# Patient Record
Sex: Female | Born: 1995 | Race: White | Hispanic: No | Marital: Single | Smoking: Current every day smoker
Health system: Southern US, Community
[De-identification: ages and names within clinical notes are randomized; demographics above are authoritative.]

---
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
## 2015-12-17 ENCOUNTER — Encounter: Payer: Self-pay | Admitting: Emergency Medicine

## 2015-12-17 ENCOUNTER — Emergency Department
Admission: EM | Admit: 2015-12-17 | Discharge: 2015-12-17 | Disposition: A | Discharge status: Home / Self Care | Payer: Self-pay | Attending: Emergency Medicine | Admitting: Emergency Medicine

## 2015-12-17 DIAGNOSIS — Z3202 Encounter for pregnancy test, result negative: Secondary | ICD-10-CM | POA: Insufficient documentation

## 2015-12-17 DIAGNOSIS — K219 Gastro-esophageal reflux disease without esophagitis: Secondary | ICD-10-CM | POA: Insufficient documentation

## 2015-12-17 DIAGNOSIS — F17200 Nicotine dependence, unspecified, uncomplicated: Secondary | ICD-10-CM | POA: Insufficient documentation

## 2015-12-17 LAB — POCT PREGNANCY, URINE: Preg Test, Ur: NEGATIVE

## 2015-12-17 LAB — COMPREHENSIVE METABOLIC PANEL
ALT: 17 U/L (ref 14–54)
AST: 21 U/L (ref 15–41)
Albumin: 4.6 g/dL (ref 3.5–5.0)
Alkaline Phosphatase: 49 U/L (ref 38–126)
Anion gap: 11 (ref 5–15)
BILIRUBIN TOTAL: 0.5 mg/dL (ref 0.3–1.2)
BUN: 10 mg/dL (ref 6–20)
CO2: 21 mmol/L — ABNORMAL LOW (ref 22–32)
CREATININE: 0.7 mg/dL (ref 0.44–1.00)
Calcium: 9.5 mg/dL (ref 8.9–10.3)
Chloride: 106 mmol/L (ref 101–111)
Glucose, Bld: 117 mg/dL — ABNORMAL HIGH (ref 65–99)
POTASSIUM: 3.7 mmol/L (ref 3.5–5.1)
Sodium: 138 mmol/L (ref 135–145)
TOTAL PROTEIN: 7.3 g/dL (ref 6.5–8.1)

## 2015-12-17 LAB — CBC
HEMATOCRIT: 41.2 % (ref 35.0–47.0)
Hemoglobin: 14 g/dL (ref 12.0–16.0)
MCH: 30.3 pg (ref 26.0–34.0)
MCHC: 34 g/dL (ref 32.0–36.0)
MCV: 89.2 fL (ref 80.0–100.0)
PLATELETS: 301 10*3/uL (ref 150–440)
RBC: 4.62 MIL/uL (ref 3.80–5.20)
RDW: 13.2 % (ref 11.5–14.5)
WBC: 7.8 10*3/uL (ref 3.6–11.0)

## 2015-12-17 LAB — URINALYSIS COMPLETE WITH MICROSCOPIC (ARMC ONLY)
BILIRUBIN URINE: NEGATIVE
GLUCOSE, UA: NEGATIVE mg/dL
HGB URINE DIPSTICK: NEGATIVE
LEUKOCYTES UA: NEGATIVE
NITRITE: NEGATIVE
Protein, ur: NEGATIVE mg/dL
SPECIFIC GRAVITY, URINE: 1.023 (ref 1.005–1.030)
pH: 6 (ref 5.0–8.0)

## 2015-12-17 LAB — LIPASE, BLOOD: LIPASE: 21 U/L (ref 11–51)

## 2015-12-17 MED ORDER — FAMOTIDINE 20 MG PO TABS: 20.0000 mg | ORAL_TABLET | Freq: Two times a day (BID) | ORAL | Status: DC

## 2015-12-17 MED ORDER — ONDANSETRON HCL 4 MG/2ML IJ SOLN
4.0000 mg | Freq: Once | INTRAMUSCULAR | Status: AC
  Administered 2015-12-17: 4 mg via INTRAVENOUS

## 2015-12-17 MED ORDER — ONDANSETRON HCL 4 MG PO TABS: 4.0000 mg | ORAL_TABLET | Freq: Every day | ORAL | Status: DC | PRN

## 2015-12-17 MED ORDER — ONDANSETRON HCL 4 MG/2ML IJ SOLN
INTRAMUSCULAR | Status: AC
  Filled 2015-12-17: qty 2

## 2015-12-17 MED ORDER — ONDANSETRON 4 MG PO TBDP
4.0000 mg | ORAL_TABLET | Freq: Once | ORAL | Status: AC | PRN
  Administered 2015-12-17: 4 mg via ORAL
  Filled 2015-12-17: qty 1

## 2015-12-17 MED ORDER — SUCRALFATE 1 G PO TABS: 1.0000 g | ORAL_TABLET | Freq: Four times a day (QID) | ORAL | Status: DC

## 2015-12-17 MED ORDER — FAMOTIDINE IN NACL 20-0.9 MG/50ML-% IV SOLN
20.0000 mg | Freq: Once | INTRAVENOUS | Status: AC
  Administered 2015-12-17: 20 mg via INTRAVENOUS
  Filled 2015-12-17: qty 50

## 2015-12-17 MED ORDER — SODIUM CHLORIDE 0.9 % IV SOLN
Freq: Once | INTRAVENOUS | Status: AC
  Administered 2015-12-17: 14:00:00 via INTRAVENOUS

## 2015-12-17 NOTE — ED Notes (Signed)
Says had stomach flu about a week ago.  Now this am started vomiting again.  Says she has indigestion feeling.

## 2015-12-17 NOTE — ED Provider Notes (Signed)
[facility] Emergency Department Provider Note     Time seen: ----------------------------------------- 3:02 PM on 12/17/2015 -----------------------------------------    I have reviewed the triage vital signs and the nursing notes.   HISTORY  Chief Complaint Gastroesophageal Reflux and Nausea    HPI Kendra Carey is a 20 y.o. female who presents ER for abdominal discomfort. Patient states she had severe vomiting but a week ago, it recurred and then resolved. Patient states his morning started vomiting again, states she has an indigestion feeling. She is under a lot of stress, takes Tums on occasion, does smoke, does not enjoy spicy foods, takes NSAIDs on occasion. She denies any fevers chills or other complaints.   History reviewed. No pertinent past medical history.  There are no active problems to display for this patient.   No past surgical history on file.  Allergies Review of patient's allergies indicates no known allergies.  Social History Social History  Substance Use Topics  . Smoking status: Current Every Day Smoker  . Smokeless tobacco: None  . Alcohol Use: No    Review of Systems Constitutional: Negative for fever. Eyes: Negative for visual changes. ENT: Negative for sore throat. Cardiovascular: Negative for chest pain. Respiratory: Negative for shortness of breath. Gastrointestinal: As for abdominal pain and nausea Genitourinary: Negative for dysuria. Musculoskeletal: Negative for back pain. Skin: Negative for rash. Neurological: Negative for headaches, focal weakness or numbness.  10-point ROS otherwise negative.  ____________________________________________   PHYSICAL EXAM:  VITAL SIGNS: ED Triage Vitals  Enc Vitals Group     BP 12/17/15 1032 147/86 mmHg     Pulse Rate 12/17/15 1032 82     Resp 12/17/15 1032 16     Temp 12/17/15 1032 97.4 F (36.3 C)     Temp Source 12/17/15 1032 Oral     SpO2 12/17/15 1032 10  %     Weight 12/17/15 1032 105 lb (47.628 kg)     Height 12/17/15 1032  (1.6 m)     Head Cir --      Peak Flow --      Pain Score 12/17/15 1033 4     Pain Loc --      Pain Edu? --      Excl. in GC? --     Constitutional: Alert and oriented. Well appearing and in no distress. Eyes: Conjunctivae are normal. PERRL. Normal extraocular movements. ENT   Head: Normocephalic and atraumatic.   Nose: No congestion/rhinnorhea.   Mouth/Throat: Mucous membranes are moist.   Neck: No stridor. Cardiovascular: Normal rate, regular rhythm. Normal and symmetric distal pulses are present in all extremities. No murmurs, rubs, or gallops. Respiratory: Normal respiratory effort without tachypnea nor retractions. Breath sounds are clear and equal bilaterally. No wheezes/rales/rhonchi. Gastrointestinal: Soft and nontender. No distention. No abdominal bruits.  Musculoskeletal: Nontender with normal range of motion in all extremities. No joint effusions.  No lower extremity tenderness nor edema. Neurologic:  Normal speech and language. No gross focal neurologic deficits are appreciated. Speech is normal. No gait instability. Skin:  Skin is warm, dry and intact. No rash noted. Psychiatric: Mood and affect are normal. Speech and behavior are normal. Patient exhibits appropriate insight and judgment. ____________________________________________  ED COURSE:  Pertinent labs & imaging results that were available during my care of the patient were reviewed by me and considered in my medical decision making (see chart for details). Patient is in no acute distress, likely GERD symptoms. She'll be given fluids antiemetics and  antacids. ____________________________________________    LABS (pertinent positives/negatives)  Labs Reviewed  COMPREHENSIVE METABOLIC PANEL - Abnormal; Notable for the following:    CO2 21 (*)    Glucose, Bld 117 (*)    All other components within normal limits   URINALYSIS COMPLETEWITH MICROSCOPIC ([facility] ONLY) - Abnormal; Notable for the following:    Color, Urine YELLOW (*)    APPearance HAZY (*)    Ketones, ur 1+ (*)    Bacteria, UA MANY (*)    Squamous Epithelial / LPF 6-30 (*)    All other components within normal limits  LIPASE, BLOOD  CBC  POC URINE PREG, ED  POCT PREGNANCY, URINE   ____________________________________________  FINAL ASSESSMENT AND PLAN  GERD  Plan: Patient with labs as dictated above. Patient was started on medications as dictated above. She'll be discharged with Pepcid and Carafate and referred to GI for follow-up. She currently is feeling better.   Emily Filbert, MD   Emily Filbert, MD 12/17/15 [phone]

## 2015-12-17 NOTE — Discharge Instructions (Signed)

## 2016-09-12 ENCOUNTER — Ambulatory Visit (INDEPENDENT_AMBULATORY_CARE_PROVIDER_SITE_OTHER): Payer: Medicaid Other | Admitting: Obstetrics and Gynecology

## 2016-09-12 VITALS — BP 105/69 | HR 86 | Wt 112.9 lb

## 2016-09-12 DIAGNOSIS — O039 Complete or unspecified spontaneous abortion without complication: Secondary | ICD-10-CM

## 2016-09-12 DIAGNOSIS — N912 Amenorrhea, unspecified: Secondary | ICD-10-CM

## 2016-09-12 DIAGNOSIS — Z369 Encounter for antenatal screening, unspecified: Secondary | ICD-10-CM

## 2016-09-12 DIAGNOSIS — Z1389 Encounter for screening for other disorder: Secondary | ICD-10-CM

## 2016-09-12 DIAGNOSIS — K219 Gastro-esophageal reflux disease without esophagitis: Secondary | ICD-10-CM | POA: Insufficient documentation

## 2016-09-12 DIAGNOSIS — Z8711 Personal history of peptic ulcer disease: Secondary | ICD-10-CM | POA: Insufficient documentation

## 2016-09-12 DIAGNOSIS — Z113 Encounter for screening for infections with a predominantly sexual mode of transmission: Secondary | ICD-10-CM

## 2016-09-12 DIAGNOSIS — Z8719 Personal history of other diseases of the digestive system: Secondary | ICD-10-CM

## 2016-09-12 NOTE — Patient Instructions (Signed)
Pregnancy and Zika Virus Disease Introduction Zika virus disease, or Zika, is an illness that can spread to people from mosquitoes that carry the virus. It may also spread from person to person through infected body fluids. Zika first occurred in [place], but recently it has spread to new areas. The virus occurs in tropical climates. The location of Zika continues to change. Most people who become infected with Zika virus do not develop serious illness. However, Zika may cause birth defects in an unborn baby whose mother is infected with the virus. It may also increase the risk of miscarriage. What are the symptoms of Zika virus disease? In many cases, people who have been infected with Zika virus do not develop any symptoms. If symptoms appear, they usually start about a week after the person is infected. Symptoms are usually mild. They may include:  Fever.  Rash.  Red eyes.  Joint pain. How does Zika virus disease spread? The main way that Zika virus spreads is through the bite of a certain type of mosquito. Unlike most types of mosquitos, which bite only at night, the type of mosquito that carries Zika virus bites both at night and during the day. Zika virus can also spread through sexual contact, through a blood transfusion, and from a mother to her baby before or during birth. Once you have had Zika virus disease, it is unlikely that you will get it again. Can I pass Zika to my baby during pregnancy? Yes, Zika can pass from a mother to her baby before or during birth. What problems can Zika cause for my baby? A woman who is infected with Zika virus while pregnant is at risk of having her baby born with a condition in which the brain or head is smaller than expected (microcephaly). Babies who have microcephaly can have developmental delays, seizures, hearing problems, and vision problems. Having Zika virus disease during pregnancy can also increase the risk of miscarriage. How can Zika  virus disease be prevented? There is no vaccine to prevent Zika. The best way to prevent the disease is to avoid infected mosquitoes and avoid exposure to body fluids that can spread the virus. Avoid any possible exposure to Zika by taking the following precautions. For women and their sex partners:  Avoid traveling to high-risk areas. The locations where Zika is being reported change often. To identify high-risk areas, check the CDC travel website: www.cdc.gov/zika/geo/index.html  If you or your sex partner must travel to a high-risk area, talk with a health care provider before and after traveling.  Take all precautions to avoid mosquito bites if you live in, or travel to, any of the high-risk areas. Insect repellents are safe to use during pregnancy.  Ask your health care provider when it is safe to have sexual contact. For women:  If you are pregnant or trying to become pregnant, avoid sexual contact with persons who may have been exposed to Zika virus, persons who have possible symptoms of Zika, or persons whose history you are unsure about. If you choose to have sexual contact with someone who may have been exposed to Zika virus, use condoms correctly during the entire duration of sexual activity, every time. Do not share sexual devices, as you may be exposed to body fluids.  Ask your health care provider about when it is safe to attempt pregnancy after a possible exposure to Zika virus. What steps should I take to avoid mosquito bites? Take these steps to avoid mosquito bites when you   are in a high-risk area:  Wear loose clothing that covers your arms and legs.  Limit your outdoor activities.  Do not open windows unless they have window screens.  Sleep under mosquito nets.  Use insect repellent. The best insect repellents have:  DEET, picaridin, oil of lemon eucalyptus (OLE), or IR3535 in them.  Higher amounts of an active ingredient in them.  Remember that insect repellents  are safe to use during pregnancy.  Do not use OLE on children who are younger than 3 years of age. Do not use insect repellent on babies who are younger than 2 months of age.  Cover your child's stroller with mosquito netting. Make sure the netting fits snugly and that any loose netting does not cover your child's mouth or nose. Do not use a blanket as a mosquito-protection cover.  Do not apply insect repellent underneath clothing.  If you are using sunscreen, apply the sunscreen before applying the insect repellent.  Treat clothing with permethrin. Do not apply permethrin directly to your skin. Follow label directions for safe use.  Get rid of standing water, where mosquitoes may reproduce. Standing water is often found in items such as buckets, bowls, animal food dishes, and flowerpots. When you return from traveling to any high-risk area, continue taking actions to protect yourself against mosquito bites for 3 weeks, even if you show no signs of illness. This will prevent spreading Zika virus to uninfected mosquitoes. What should I know about the sexual transmission of Zika? People can spread Zika to their sexual partners during vaginal, anal, or oral sex, or by sharing sexual devices. Many people with Zika do not develop symptoms, so a person could spread the disease without knowing that they are infected. The greatest risk is to women who are pregnant or who may become pregnant. Zika virus can live longer in semen than it can live in blood. Couples can prevent sexual transmission of the virus by:  Using condoms correctly during the entire duration of sexual activity, every time. This includes vaginal, anal, and oral sex.  Not sharing sexual devices. Sharing increases your risk of being exposed to body fluid from another person.  Avoiding all sexual activity until your health care provider says it is safe. Should I be tested for Zika virus? A sample of your blood can be tested for Zika  virus. A pregnant woman should be tested if she may have been exposed to the virus or if she has symptoms of Zika. She may also have additional tests done during her pregnancy, such ultrasound testing. Talk with your health care provider about which tests are recommended. This information is not intended to replace advice given to you by your health care provider. Make sure you discuss any questions you have with your health care provider. Document Released: 07/01/2015 Document Revised: 03/17/2016 Document Reviewed: 06/24/2015  2017 Elsevier Minor Illnesses and Medications in Pregnancy  Cold/Flu:  Sudafed for congestion- Robitussin (plain) for cough- Tylenol for discomfort.  Please follow the directions on the label.  Try not to take any more than needed.  OTC Saline nasal spray and air humidifier or cool-mist  Vaporizer to sooth nasal irritation and to loosen congestion.  It is also important to increase intake of non carbonated fluids, especially if you have a fever.  Constipation:  Colace-2 capsules at bedtime; Metamucil- follow directions on label; Senokot- 1 tablet at bedtime.  Any one of these medications can be used.  It is also very important to increase   fluids and fruits along with regular exercise.  If problem persists please call the office.  Diarrhea:  Kaopectate as directed on the label.  Eat a bland diet and increase fluids.  Avoid highly seasoned foods.  Headache:  Tylenol 1 or 2 tablets every 3-4 hours as needed  Indigestion:  Maalox, Mylanta, Tums or Rolaids- as directed on label.  Also try to eat small meals and avoid fatty, greasy or spicy foods.  Nausea with or without Vomiting:  Nausea in pregnancy is caused by increased levels of hormones in the body which influence the digestive system and cause irritation when stomach acids accumulate.  Symptoms usually subside after 1st trimester of pregnancy.  Try the following: 1. Keep saltines, graham crackers or dry toast by your bed to  eat upon awakening. 2. Don't let your stomach get empty.  Try to eat 5-6 small meals per day instead of 3 large ones. 3. Avoid greasy fatty or highly seasoned foods.  4. Take OTC Unisom 1 tablet at bed time along with OTC Vitamin B6 25-50 mg 3 times per day.    If nausea continues with vomiting and you are unable to keep down food and fluids you may need a prescription medication.  Please notify your provider.   Sore throat:  Chloraseptic spray, throat lozenges and or plain Tylenol.  Vaginal Yeast Infection:  OTC Monistat for 7 days as directed on label.  If symptoms do not resolve within a week notify provider.  If any of the above problems do not subside with recommended treatment please call the office for further assistance.   Do not take Aspirin, Advil, Motrin or Ibuprofen.  * * OTC= Over the counter Hyperemesis Gravidarum Hyperemesis gravidarum is a severe form of nausea and vomiting that happens during pregnancy. Hyperemesis is worse than morning sickness. It may cause you to have nausea or vomiting all day for many days. It may keep you from eating and drinking enough food and liquids. Hyperemesis usually occurs during the first half (the first 20 weeks) of pregnancy. It often goes away once a woman is in her second half of pregnancy. However, sometimes hyperemesis continues through an entire pregnancy. What are the causes? The cause of this condition is not known. It may be related to changes in chemicals (hormones) in the body during pregnancy, such as the high level of pregnancy hormone (human chorionic gonadotropin) or the increase in the female sex hormone (estrogen). What are the signs or symptoms? Symptoms of this condition include:  Severe nausea and vomiting.  Nausea that does not go away.  Vomiting that does not allow you to keep any food down.  Weight loss.  Body fluid loss (dehydration).  Having no desire to eat, or not liking food that you have previously  enjoyed. How is this diagnosed? This condition may be diagnosed based on:  A physical exam.  Your medical history.  Your symptoms.  Blood tests.  Urine tests. How is this treated? This condition may be managed with medicine. If medicines to do not help relieve nausea and vomiting, you may need to receive fluids through an IV tube at the hospital. Follow these instructions at home:  Take over-the-counter and prescription medicines only as told by your health care provider.  Avoid iron pills and multivitamins that contain iron for the first 3-4 months of pregnancy. If you take prescription iron pills, do not stop taking them unless your health care provider approves.  Take the following actions to help   prevent nausea and vomiting:  In the morning, before getting out of bed, try eating a couple of dry crackers or a piece of toast.  Avoid foods and smells that upset your stomach. Fatty and spicy foods may make nausea worse.  Eat 5-6 small meals a day.  Do not drink fluids while eating meals. Drink between meals.  Eat or suck on things that have ginger in them. Ginger can help relieve nausea.  Avoid food preparation. The smell of food can spoil your appetite or trigger nausea.  Follow instructions from your health care provider about eating or drinking restrictions.  For snacks, eat high-protein foods, such as cheese.  Keep all follow-up and pre-birth (prenatal) visits as told by your health care provider. This is important. Contact a health care provider if:  You have pain in your abdomen.  You have a severe headache.  You have vision problems.  You are losing weight. Get help right away if:  You cannot drink fluids without vomiting.  You vomit blood.  You have constant nausea and vomiting.  You are very weak.  You are very thirsty.  You feel dizzy.  You faint.  You have a fever or other symptoms that last for more than 2-3 days.  You have a fever and  your symptoms suddenly get worse. Summary  Hyperemesis gravidarum is a severe form of nausea and vomiting that happens during pregnancy.  Making some changes to your eating habits may help relieve nausea and vomiting.  This condition may be managed with medicine.  If medicines to do not help relieve nausea and vomiting, you may need to receive fluids through an IV tube at the hospital. This information is not intended to replace advice given to you by your health care provider. Make sure you discuss any questions you have with your health care provider. Document Released: 10/10/2005 Document Revised: 06/08/2016 Document Reviewed: 06/08/2016 Elsevier Interactive Patient Education  2017 [employer]vier [employer] Commonly Asked Questions During Pregnancy  Cats: A parasite can be excreted in cat feces.  To avoid exposure you need to have another person empty the little box.  If you must empty the litter box you will need to wear gloves.  Wash your hands after handling your cat.  This parasite can also be found in raw or undercooked meat so this should also be avoided.  Colds, Sore Throats, Flu: Please check your medication sheet to see what you can take for symptoms.  If your symptoms are unrelieved by these medications please call the office.  Dental Work: Most any dental work your dentist recommends is permitted.  X-rays should only be taken during the first trimester if absolutely necessary.  Your abdomen should be shielded with a lead apron during all x-rays.  Please notify your provider prior to receiving any x-rays.  Novocaine is fine; gas is not recommended.  If your dentist requires a note from us prior to dental work please call the office and we will provide one for you.  Exercise: Exercise is an important part of staying healthy during your pregnancy.  You may continue most exercises you were accustomed to prior to pregnancy.  Later in your pregnancy you will most likely notice you have difficulty  with activities requiring balance like riding a bicycle.  It is important that you listen to your body and avoid activities that put you at a higher risk of falling.  Adequate rest and staying well hydrated are a must!  If you have questions   about the safety of specific activities ask your provider.    Exposure to Children with illness: Try to avoid obvious exposure; report any symptoms to us when noted,  If you have chicken pos, red measles or mumps, you should be immune to these diseases.   Please do not take any vaccines while pregnant unless you have checked with your OB provider.  Fetal Movement: After 28 weeks we recommend you do "kick counts" twice daily.  Lie or sit down in a calm quiet environment and count your baby movements "kicks".  You should feel your baby at least 10 times per hour.  If you have not felt 10 kicks within the first hour get up, walk around and have something sweet to eat or drink then repeat for an additional hour.  If count remains less than 10 per hour notify your provider.  Fumigating: Follow your pest control agent's advice as to how long to stay out of your home.  Ventilate the area well before re-entering.  Hemorrhoids:   Most over-the-counter preparations can be used during pregnancy.  Check your medication to see what is safe to use.  It is important to use a stool softener or fiber in your diet and to drink lots of liquids.  If hemorrhoids seem to be getting worse please call the office.   Hot Tubs:  Hot tubs Jacuzzis and saunas are not recommended while pregnant.  These increase your internal body temperature and should be avoided.  Intercourse:  Sexual intercourse is safe during pregnancy as long as you are comfortable, unless otherwise advised by your provider.  Spotting may occur after intercourse; report any bright red bleeding that is heavier than spotting.  Labor:  If you know that you are in labor, please go to the hospital.  If you are unsure, please  call the office and let us help you decide what to do.  Lifting, straining, etc:  If your job requires heavy lifting or straining please check with your provider for any limitations.  Generally, you should not lift items heavier than that you can lift simply with your hands and arms (no back muscles)  Painting:  Paint fumes do not harm your pregnancy, but may make you ill and should be avoided if possible.  Latex or water based paints have less odor than oils.  Use adequate ventilation while painting.  Permanents & Hair Color:  Chemicals in hair dyes are not recommended as they cause increase hair dryness which can increase hair loss during pregnancy.  " Highlighting" and permanents are allowed.  Dye may be absorbed differently and permanents may not hold as well during pregnancy.  Sunbathing:  Use a sunscreen, as skin burns easily during pregnancy.  Drink plenty of fluids; avoid over heating.  Tanning Beds:  Because their possible side effects are still unknown, tanning beds are not recommended.  Ultrasound Scans:  Routine ultrasounds are performed at approximately 20 weeks.  You will be able to see your baby's general anatomy an if you would like to know the gender this can usually be determined as well.  If it is questionable when you conceived you may also receive an ultrasound early in your pregnancy for dating purposes.  Otherwise ultrasound exams are not routinely performed unless there is a medical necessity.  Although you can request a scan we ask that you pay for it when conducted because insurance does not cover " patient request" scans.  Work: If your pregnancy proceeds without complications you   may work until your due date, unless your physician or employer advises otherwise.  Round Ligament Pain/Pelvic Discomfort:  Sharp, shooting pains not associated with bleeding are fairly common, usually occurring in the second trimester of pregnancy.  They tend to be worse when standing up or when  you remain standing for long periods of time.  These are the result of pressure of certain pelvic ligaments called "round ligaments".  Rest, Tylenol and heat seem to be the most effective relief.  As the womb and fetus grow, they rise out of the pelvis and the discomfort improves.  Please notify the office if your pain seems different than that described.  It may represent a more serious condition.   

## 2016-09-12 NOTE — Progress Notes (Signed)
Kendra SimasSarah Carey presents for NOB nurse interview visit. Pregnancy confirmation done at [facility] on 08/04/2016. LMP- 07/02/2016, UPT: positive.   G-2  P-[id number] . Pregnancy education material explained and given.  Cat is in the home. Her significant other cleans the litter box. NOB labs ordered.  HIV labs and Drug screen were explained optional and she did not decline. Drug screen ordered. PNV encouraged. Genetic screening to discuss with provider. Ultrasound ordered for hx SAB. Pt. To follow up with provider in 2 weeks for NOB physical.  All questions answered.

## 2016-09-13 LAB — GC/CHLAMYDIA PROBE AMP
CHLAMYDIA, DNA PROBE: NEGATIVE
Neisseria gonorrhoeae by PCR: NEGATIVE

## 2016-09-14 LAB — URINE CULTURE, OB REFLEX

## 2016-09-14 LAB — HEPATITIS B SURFACE ANTIGEN: HEP B S AG: NEGATIVE

## 2016-09-14 LAB — CBC WITH DIFFERENTIAL/PLATELET
BASOS: 0 %
Basophils Absolute: 0 10*3/uL (ref 0.0–0.2)
EOS (ABSOLUTE): 0 10*3/uL (ref 0.0–0.4)
EOS: 1 %
HEMATOCRIT: 40.2 % (ref 34.0–46.6)
Hemoglobin: 13.7 g/dL (ref 11.1–15.9)
IMMATURE GRANS (ABS): 0 10*3/uL (ref 0.0–0.1)
IMMATURE GRANULOCYTES: 0 %
LYMPHS: 20 %
Lymphocytes Absolute: 1 10*3/uL (ref 0.7–3.1)
MCH: 30.4 pg (ref 26.6–33.0)
MCHC: 34.1 g/dL (ref 31.5–35.7)
MCV: 89 fL (ref 79–97)
MONOS ABS: 0.4 10*3/uL (ref 0.1–0.9)
Monocytes: 9 %
NEUTROS PCT: 70 %
Neutrophils Absolute: 3.5 10*3/uL (ref 1.4–7.0)
PLATELETS: 236 10*3/uL (ref 150–379)
RBC: 4.51 x10E6/uL (ref 3.77–5.28)
RDW: 13.3 % (ref 12.3–15.4)
WBC: 4.9 10*3/uL (ref 3.4–10.8)

## 2016-09-14 LAB — CULTURE, OB URINE

## 2016-09-14 LAB — ABO

## 2016-09-14 LAB — HIV ANTIBODY (ROUTINE TESTING W REFLEX): HIV Screen 4th Generation wRfx: NONREACTIVE

## 2016-09-14 LAB — VARICELLA ZOSTER ANTIBODY, IGG: VARICELLA: 187 {index} (ref >165)

## 2016-09-14 LAB — RUBELLA SCREEN: Rubella Antibodies, IGG: 1.89 index (ref >0.99)

## 2016-09-14 LAB — RH TYPE: Rh Factor: POSITIVE

## 2016-09-14 LAB — ANTIBODY SCREEN: Antibody Screen: NEGATIVE

## 2016-09-14 LAB — RPR: RPR Ser Ql: NONREACTIVE

## 2016-09-19 LAB — CANNABINOID (GC/MS), URINE
CARBOXY THC UR: 90 ng/mL
Cannabinoid: POSITIVE — AB

## 2016-09-19 LAB — URINALYSIS, ROUTINE W REFLEX MICROSCOPIC
BILIRUBIN UA: NEGATIVE
Glucose, UA: NEGATIVE
Ketones, UA: NEGATIVE
Leukocytes, UA: NEGATIVE
NITRITE UA: NEGATIVE
PROTEIN UA: NEGATIVE
RBC UA: NEGATIVE
Specific Gravity, UA: 1.021 (ref 1.005–1.030)
UUROB: 0.2 mg/dL (ref 0.2–1.0)
pH, UA: 7 (ref 5.0–7.5)

## 2016-09-19 LAB — MONITOR DRUG PROFILE 14(MW)
AMPHETAMINE SCREEN URINE: NEGATIVE ng/mL
BARBITURATE SCREEN URINE: NEGATIVE ng/mL
BENZODIAZEPINE SCREEN, URINE: NEGATIVE ng/mL
BUPRENORPHINE, URINE: NEGATIVE ng/mL
COCAINE(METAB.)SCREEN, URINE: NEGATIVE ng/mL
Creatinine(Crt), U: 79.2 mg/dL (ref 20.0–300.0)
FENTANYL, URINE: NEGATIVE pg/mL
METHADONE SCREEN, URINE: NEGATIVE ng/mL
Meperidine Screen, Urine: NEGATIVE ng/mL
OPIATE SCREEN URINE: NEGATIVE ng/mL
OXYCODONE+OXYMORPHONE UR QL SCN: NEGATIVE ng/mL
PH UR, DRUG SCRN: 7.7 (ref 4.5–8.9)
PHENCYCLIDINE QUANTITATIVE URINE: NEGATIVE ng/mL
Propoxyphene Scrn, Ur: NEGATIVE ng/mL
SPECIFIC GRAVITY: 1.021
Tramadol Screen, Urine: NEGATIVE ng/mL

## 2016-09-19 LAB — NICOTINE SCREEN, URINE: COTININE UR QL SCN: POSITIVE ng/mL

## 2016-09-20 ENCOUNTER — Ambulatory Visit (INDEPENDENT_AMBULATORY_CARE_PROVIDER_SITE_OTHER): Payer: Medicaid Other

## 2016-09-20 DIAGNOSIS — O039 Complete or unspecified spontaneous abortion without complication: Secondary | ICD-10-CM

## 2016-09-29 ENCOUNTER — Other Ambulatory Visit: Payer: Self-pay | Admitting: Obstetrics and Gynecology

## 2016-09-29 ENCOUNTER — Ambulatory Visit (INDEPENDENT_AMBULATORY_CARE_PROVIDER_SITE_OTHER): Payer: Medicaid Other | Admitting: Obstetrics and Gynecology

## 2016-09-29 VITALS — BP 119/72 | HR 83 | Wt 116.9 lb

## 2016-09-29 DIAGNOSIS — O09299 Supervision of pregnancy with other poor reproductive or obstetric history, unspecified trimester: Secondary | ICD-10-CM

## 2016-09-29 DIAGNOSIS — Z3682 Encounter for antenatal screening for nuchal translucency: Secondary | ICD-10-CM

## 2016-09-29 DIAGNOSIS — Z3482 Encounter for supervision of other normal pregnancy, second trimester: Secondary | ICD-10-CM

## 2016-09-29 DIAGNOSIS — K219 Gastro-esophageal reflux disease without esophagitis: Secondary | ICD-10-CM

## 2016-09-29 DIAGNOSIS — R825 Elevated urine levels of drugs, medicaments and biological substances: Secondary | ICD-10-CM

## 2016-09-29 DIAGNOSIS — Z124 Encounter for screening for malignant neoplasm of cervix: Secondary | ICD-10-CM

## 2016-09-29 LAB — POCT URINALYSIS DIPSTICK
BILIRUBIN UA: NEGATIVE
GLUCOSE UA: NEGATIVE
Ketones, UA: NEGATIVE
NITRITE UA: NEGATIVE
Protein, UA: NEGATIVE
Spec Grav, UA: 1.025
UROBILINOGEN UA: NEGATIVE
pH, UA: 6

## 2016-09-29 NOTE — Patient Instructions (Signed)
Second Trimester of Pregnancy The second trimester is from week 13 through week 28 (months 4 through 6). The second trimester is often a time when you feel your best. Your body has also adjusted to being pregnant, and you begin to feel better physically. Usually, morning sickness has lessened or quit completely, you may have more energy, and you may have an increase in appetite. The second trimester is also a time when the fetus is growing rapidly. At the end of the sixth month, the fetus is about 9 inches long and weighs about 1 pounds. You will likely begin to feel the baby move (quickening) between 18 and 20 weeks of the pregnancy. Body changes during your second trimester Your body continues to go through many changes during your second trimester. The changes vary from woman to woman.  Your weight will continue to increase. You will notice your lower abdomen bulging out.  You may begin to get stretch marks on your hips, abdomen, and breasts.  You may develop headaches that can be relieved by medicines. The medicines should be approved by your health care provider.  You may urinate more often because the fetus is pressing on your bladder.  You may develop or continue to have heartburn as a result of your pregnancy.  You may develop constipation because certain hormones are causing the muscles that push waste through your intestines to slow down.  You may develop hemorrhoids or swollen, bulging veins (varicose veins).  You may have back pain. This is caused by:  Weight gain.  Pregnancy hormones that are relaxing the joints in your pelvis.  A shift in weight and the muscles that support your balance.  Your breasts will continue to grow and they will continue to become tender.  Your gums may bleed and may be sensitive to brushing and flossing.  Dark spots or blotches (chloasma, mask of pregnancy) may develop on your face. This will likely fade after the baby is born.  A dark line  from your belly button to the pubic area (linea nigra) may appear. This will likely fade after the baby is born.  You may have changes in your hair. These can include thickening of your hair, rapid growth, and changes in texture. Some women also have hair loss during or after pregnancy, or hair that feels dry or thin. Your hair will most likely return to normal after your baby is born. What to expect at prenatal visits During a routine prenatal visit:  You will be weighed to make sure you and the fetus are growing normally.  Your blood pressure will be taken.  Your abdomen will be measured to track your baby's growth.  The fetal heartbeat will be listened to.  Any test results from the previous visit will be discussed. Your health care provider may ask you:  How you are feeling.  If you are feeling the baby move.  If you have had any abnormal symptoms, such as leaking fluid, bleeding, severe headaches, or abdominal cramping.  If you are using any tobacco products, including cigarettes, chewing tobacco, and electronic cigarettes.  If you have any questions. Other tests that may be performed during your second trimester include:  Blood tests that check for:  Low iron levels (anemia).  Gestational diabetes (between 24 and 28 weeks).  Rh antibodies. This is to check for a protein on red blood cells (Rh factor).  Urine tests to check for infections, diabetes, or protein in the urine.  An ultrasound to   confirm the proper growth and development of the baby.  An amniocentesis to check for possible genetic problems.  Fetal screens for spina bifida and Down syndrome.  HIV (human immunodeficiency virus) testing. Routine prenatal testing includes screening for HIV, unless you choose not to have this test. Follow these instructions at home: Eating and drinking  Continue to eat regular, healthy meals.  Avoid raw meat, uncooked cheese, cat litter boxes, and soil used by cats. These  carry germs that can cause birth defects in the baby.  Take your prenatal vitamins.  Take 1500-2000 mg of calcium daily starting at the 20th week of pregnancy until you deliver your baby.  If you develop constipation:  Take over-the-counter or prescription medicines.  Drink enough fluid to keep your urine clear or pale yellow.  Eat foods that are high in fiber, such as fresh fruits and vegetables, whole grains, and beans.  Limit foods that are high in fat and processed sugars, such as fried and sweet foods. Activity  Exercise only as directed by your health care provider. Experiencing uterine cramps is a good sign to stop exercising.  Avoid heavy lifting, wear low heel shoes, and practice good posture.  Wear your seat belt at all times when driving.  Rest with your legs elevated if you have leg cramps or low back pain.  Wear a good support bra for breast tenderness.  Do not use hot tubs, steam rooms, or saunas. Lifestyle  Avoid all smoking, herbs, alcohol, and unprescribed drugs. These chemicals affect the formation and growth of the baby.  Do not use any products that contain nicotine or tobacco, such as cigarettes and e-cigarettes. If you need help quitting, ask your health care provider.  A sexual relationship may be continued unless your health care provider directs you otherwise. General instructions  Follow your health care provider's instructions regarding medicine use. There are medicines that are either safe or unsafe to take during pregnancy.  Take warm sitz baths to soothe any pain or discomfort caused by hemorrhoids. Use hemorrhoid cream if your health care provider approves.  If you develop varicose veins, wear support hose. Elevate your feet for 15 minutes, 3-4 times a day. Limit salt in your diet.  Visit your dentist if you have not gone yet during your pregnancy. Use a soft toothbrush to brush your teeth and be gentle when you floss.  Keep all follow-up  prenatal visits as told by your health care provider. This is important. Contact a health care provider if:  You have dizziness.  You have mild pelvic cramps, pelvic pressure, or nagging pain in the abdominal area.  You have persistent nausea, vomiting, or diarrhea.  You have a bad smelling vaginal discharge.  You have pain with urination. Get help right away if:  You have a fever.  You are leaking fluid from your vagina.  You have spotting or bleeding from your vagina.  You have severe abdominal cramping or pain.  You have rapid weight gain or weight loss.  You have shortness of breath with chest pain.  You notice sudden or extreme swelling of your face, hands, ankles, feet, or legs.  You have not felt your baby move in over an hour.  You have severe headaches that do not go away with medicine.  You have vision changes. Summary  The second trimester is from week 13 through week 28 (months 4 through 6). It is also a time when the fetus is growing rapidly.  Your body goes   through many changes during pregnancy. The changes vary from woman to woman.  Avoid all smoking, herbs, alcohol, and unprescribed drugs. These chemicals affect the formation and growth your baby.  Do not use any tobacco products, such as cigarettes, chewing tobacco, and e-cigarettes. If you need help quitting, ask your health care provider.  Contact your health care provider if you have any questions. Keep all prenatal visits as told by your health care provider. This is important. This information is not intended to replace advice given to you by your health care provider. Make sure you discuss any questions you have with your health care provider. Document Released: 10/04/2001 Document Revised: 03/17/2016 Document Reviewed: 12/11/2012 Elsevier Interactive Patient Education  2017 Elsevier Inc.  

## 2016-09-29 NOTE — Progress Notes (Signed)
OBSTETRIC INITIAL PRENATAL VISIT  Subjective:    Kendra Carey is being seen today for her first obstetrical visit.  This is not a planned pregnancy. She is a 20 y.o. G2P0010 female at 5687w5d gestation, Estimated Date of Delivery: 04/08/17 with last menstrual period 07/02/2016 (consistent with 11 week sono). Her obstetrical history is significant for none. Relationship with FOB: significant other, living together. Patient does intend to breast feed. Pregnancy history fully reviewed.    Obstetric History   G2   P0   T0   P0   A1   L0    SAB1   TAB0   Ectopic0   Multiple0   Live Births0     # Outcome Date GA Lbr Len/2nd Weight Sex Delivery Anes PTL Lv  2 Current           1 SAB 08/2014        ND    Obstetric Comments  2015-pregnancy 10wks but stopped development at 7wk    Gynecologic History:  Last pap smear was: No prior pap history.   Denies history of STIs.    No past medical history on file.   Family History  Problem Relation Age of Onset  . Asthma Mother   . Asthma Father   . Diabetes Father     BORDERLINE  . Heart failure Father   . Hyperlipidemia Father   . Hypertension Father   . Stroke Father     PARTIAL  . Migraines Sister   . Rheum arthritis Maternal Grandmother   . Heart failure Maternal Grandmother   . Cancer Maternal Grandfather     COLON, PROSTATE  . Heart failure Maternal Grandfather   . Hypertension Maternal Grandfather   . Stroke Maternal Grandfather   . Cancer Paternal Grandmother     PANCREATIC     Past Surgical History:  Procedure Laterality Date  . DILATION AND CURETTAGE OF UTERUS  2015   SAB     Social History   Social History  . Marital status: Single    Spouse name: N/A  . Number of children: N/A  . Years of education: N/A   Occupational History  . Not on file.   Social History Main Topics  . Smoking status: Former Games developermoker  . Smokeless tobacco: Never Used  . Alcohol use No  . Drug use: No  . Sexual activity: Yes   Partners: Male    Birth control/ protection: None   Other Topics Concern  . Not on file   Social History Narrative  . No narrative on file     Current Outpatient Prescriptions on File Prior to Visit  Medication Sig Dispense Refill  . Prenatal Vit-Fe Fumarate-FA (MULTIVITAMIN-PRENATAL) 27-0.8 MG TABS tablet Take 1 tablet by mouth daily at 12 noon.     No current facility-administered medications on file prior to visit.     No Known Allergies   Review of Systems General:Not Present- Fever, Weight Loss and Weight Gain. Skin:Not Present- Rash. HEENT:Not Present- Blurred Vision, Headache and Bleeding Gums. Respiratory:Not Present- Difficulty Breathing. Breast:Not Present- Breast Mass. Cardiovascular:Not Present- Chest Pain, Elevated Blood Pressure, Fainting / Blacking Out and Shortness of Breath. Gastrointestinal:Present - Nausea (improving).  Not Present- Abdominal Pain, Constipation, and Vomiting. Female Genitourinary:Not Present- Frequency, Painful Urination, Pelvic Pain, Vaginal Bleeding, Vaginal Discharge, Contractions, regular, Fetal Movements Decreased, Urinary Complaints and Vaginal Fluid. Musculoskeletal:Not Present- Back Pain and Leg Cramps. Neurological:Not Present- Dizziness. Psychiatric:Not Present- Depression.     Objective:   Blood  pressure 119/72, pulse 83, weight 116 lb 14.4 oz (53 kg), last menstrual period 07/02/2016.  Body mass index is 20.71 kg/m.  General Appearance:    Alert, cooperative, no distress, appears stated age  Head:    Normocephalic, without obvious abnormality, atraumatic  Eyes:    PERRL, conjunctiva/corneas clear, EOM's intact, both eyes  Ears:    Normal external ear canals, both ears  Nose:   Nares normal, septum midline, mucosa normal, no drainage or sinus tenderness  Throat:   Lips, mucosa, and tongue normal; teeth and gums normal  Neck:   Supple, symmetrical, trachea midline, no adenopathy; thyroid: no  enlargement/tenderness/nodules; no carotid bruit or JVD  Back:     Symmetric, no curvature, ROM normal, no CVA tenderness  Lungs:     Clear to auscultation bilaterally, respirations unlabored  Chest Wall:    No tenderness or deformity   Heart:    Regular rate and rhythm, S1 and S2 normal, no murmur, rub or gallop  Breast Exam:    No tenderness, masses, or nipple abnormality  Abdomen:     Soft, non-tender, bowel sounds active all four quadrants, no masses, no organomegaly.  FH 12.  [id number] bpm.  Genitalia:    Pelvic:external genitalia normal, vagina without lesions, discharge, or tenderness, rectovaginal septum  normal. Cervix normal in appearance, no cervical motion tenderness, no adnexal masses or tenderness.  Pregnancy positive findings: uterine enlargement: 12-13 wk size, nontender.   Rectal:    Normal external sphincter.  No hemorrhoids appreciated. Internal exam not done.   Extremities:   Extremities normal, atraumatic, no cyanosis or edema  Pulses:   2+ and symmetric all extremities  Skin:   Skin color, texture, turgor normal, no rashes or lesions  Lymph nodes:   Cervical, supraclavicular, and axillary nodes normal  Neurologic:   CNII-XII intact, normal strength, sensation and reflexes throughout     Assessment:   Pregnancy at 12 and 5/7 weeks   H/o miscarriage x 1 Marijuana use  H/o GERD   Plan:   Initial labs reviewed. Pap smear performed today.  Prenatal vitamins encouraged. Problem list reviewed and updated. New OB counseling:  The patient has been given an overview regarding routine prenatal care.  Recommendations regarding diet, weight gain, and exercise in pregnancy were given. Prenatal testing, optional genetic testing, and ultrasound use in pregnancy were reviewed.  AFP3 discussed: ordered. Benefits of Breast Feeding were discussed. The patient is encouraged to consider nursing her baby post partum. Patient counseled on marijuana cessation in pregnancy (+UDS on NOB drug  screen).  Patient notes that she has not used in ~ 2 weeks. Continue to encourage abstinence.  H/o GERD, will monitor during pregnancy and treat accordingly.  Follow up in 4 weeks.  50% of 30 min visit spent on counseling and coordination of care.     Hildred LaserAnika Jaydn Moscato, MD [facility]

## 2016-09-30 ENCOUNTER — Other Ambulatory Visit: Payer: Self-pay | Admitting: Obstetrics and Gynecology

## 2016-09-30 ENCOUNTER — Ambulatory Visit (INDEPENDENT_AMBULATORY_CARE_PROVIDER_SITE_OTHER): Payer: Medicaid Other

## 2016-09-30 DIAGNOSIS — Z3682 Encounter for antenatal screening for nuchal translucency: Secondary | ICD-10-CM

## 2016-10-02 ENCOUNTER — Encounter: Payer: Self-pay | Admitting: Obstetrics and Gynecology

## 2016-10-02 DIAGNOSIS — O09299 Supervision of pregnancy with other poor reproductive or obstetric history, unspecified trimester: Secondary | ICD-10-CM | POA: Insufficient documentation

## 2016-10-02 DIAGNOSIS — R825 Elevated urine levels of drugs, medicaments and biological substances: Secondary | ICD-10-CM | POA: Insufficient documentation

## 2016-10-02 LAB — FIRST TRIMESTER SCREEN W/NT
CRL: 65.5 mm
DIA MoM: 1.04
DIA VALUE: 297.8 pg/mL
Gest Age-Collect: 12.7 weeks
HCG VALUE: 92.4 [IU]/mL
Maternal Age At EDD: 22.1 years
NUCHAL TRANSLUCENCY MOM: 1.31
Nuchal Translucency: 1.6 mm
Number of Fetuses: 1
PAPP-A MOM: 0.89
PAPP-A VALUE: 1243.7 ng/mL
Test Results:: NEGATIVE
WEIGHT: 116 [lb_av]
hCG MoM: 0.9

## 2016-10-06 LAB — PAP IG, CT-NG, RFX HPV ASCU
Chlamydia, Nuc. Acid Amp: NEGATIVE
GONOCOCCUS BY NUCLEIC ACID AMP: NEGATIVE
PAP SMEAR COMMENT: 0

## 2016-10-24 HISTORY — PX: WISDOM TOOTH EXTRACTION: SHX21

## 2016-10-24 NOTE — L&D Delivery Note (Signed)
Called in room to see patient, complete and pushing. SVE: 10/100/+3, vertex.   At 1520 a viable female infant was delivered via compound presentation in left occiput anterior position. Loose nuchal cord x 1 reduced via somersaulting technique. Infant immediately to maternal abdomen. Receiving nurse present at bedside for birth. Delayed cord clamping. Cord blood collected. APGAR: 8, 9; weight pending.    Pitcoin infusing. Spontaneous delivery of placenta at 1526 . Right vaginal skidmark hemostatic. EBL: 200 ml. Vault check completed. Epidural anesthesia. Counts correct x 2.    Mom to postpartum.  Baby to Couplet care / Skin to Skin.  Routine postpartum care and orders. Reassess at needed.   Gunnar BullaJenkins Michelle Samarah Hogle, CNM 04/03/2017, 3:37 PM

## 2016-10-27 ENCOUNTER — Ambulatory Visit (INDEPENDENT_AMBULATORY_CARE_PROVIDER_SITE_OTHER): Payer: Medicaid Other | Admitting: Obstetrics and Gynecology

## 2016-10-27 VITALS — BP 94/55 | HR 110 | Wt 118.2 lb

## 2016-10-27 DIAGNOSIS — Z3402 Encounter for supervision of normal first pregnancy, second trimester: Secondary | ICD-10-CM

## 2016-10-27 LAB — POCT URINALYSIS DIPSTICK
Bilirubin, UA: NEGATIVE
GLUCOSE UA: NEGATIVE
Ketones, UA: 15
Nitrite, UA: NEGATIVE
Protein, UA: NEGATIVE
Spec Grav, UA: >=1.03
UROBILINOGEN UA: NEGATIVE
pH, UA: 6

## 2016-10-27 MED ORDER — PRENATE MINI 18-0.6-0.4-350 MG PO CAPS: 18.0000 mg | ORAL_CAPSULE | Freq: Every day | ORAL | 11 refills | Status: DC

## 2016-10-27 NOTE — Patient Instructions (Signed)
Pregnancy and Influenza Influenza, also called the flu, is an infection of the respiratory tract. If you are pregnant, you are more likely to catch the flu. You are also more likely to have a more serious case of the flu. This is because pregnancy lowers your body's ability to fight off infections (it weakens your immune system). It also puts additional stress on your heart and lungs, which makes you more likely to have complications. Having a bad case of the flu, especially with a high fever, can be dangerous for your developing baby. It can cause you to go into early labor. How do people get the flu? The flu is caused by the influenza virus. This virus is common every year in the fall and winter. It spreads when virus particles get passed from person to person. You can get the virus if you are near a sick person who is coughing or sneezing. You can also get the virus if you touch something that has the virus on it and then touch your face. How can I protect myself against the flu?  Get a flu shot. The best way to prevent the flu is to get a flu shot before flu season starts. The flu shot is not dangerous for your developing baby. It may even help protect your baby from the flu for up to 6 months after birth. The flu shot is one type of flu vaccine. Another type is a nasal spray vaccine. Do not get the nasal spray vaccine. It is not approved for pregnancy.  Do not come in close contact with sick people.  Do not share food, drinks, or utensils with other people.  Wash your hands often. Use hand sanitizer when soap and water are not available. What should I do if I have flu symptoms? If you have any flu symptoms, call your health care provider right away. Flu symptoms include:  Fever or chills.  Muscle aches.  Headache.  Sore throat.  Nasal congestion.  Cough.  Feeling tired.  Loss of appetite.  Vomiting.  Diarrhea.  You may be able to take an antiviral medicine to keep the flu  from becoming severe and to shorten how long it lasts. What should I do at home if I am diagnosed with the flu?  Do not take any medicine, including cold or flu medicine, unless directed by your health care provider.  If you take antiviral medicine, make sure you finish it even if you start to feel better.  Drink enough fluid to keep your urine clear or pale yellow.  Get plenty of rest. When would I seek immediate medical care if I have the flu?  You have trouble breathing.  You have chest pain.  You begin to have labor pains.  You have a high fever that does not go down after you take medicine.  You do not feel your baby move.  You have diarrhea or vomiting that will not go away. This information is not intended to replace advice given to you by your health care provider. Make sure you discuss any questions you have with your health care provider. Document Released: 08/12/2008 Document Revised: 03/17/2016 Document Reviewed: 09/06/2013 Elsevier Interactive Patient Education  2017 Elsevier Inc.  

## 2016-10-27 NOTE — Progress Notes (Signed)
ROB: Mild occasional cramping, noting flutters.  Unsure about receiving flu vaccine.  Given handout on pregnancy and flu vaccine.  RTC in 4 weeks, for anatomy scan at that time.

## 2016-11-18 ENCOUNTER — Encounter: Payer: Self-pay | Admitting: Obstetrics and Gynecology

## 2016-11-24 ENCOUNTER — Ambulatory Visit (INDEPENDENT_AMBULATORY_CARE_PROVIDER_SITE_OTHER): Payer: Medicaid Other

## 2016-11-24 ENCOUNTER — Ambulatory Visit (INDEPENDENT_AMBULATORY_CARE_PROVIDER_SITE_OTHER): Payer: Medicaid Other | Admitting: Obstetrics and Gynecology

## 2016-11-24 ENCOUNTER — Encounter: Payer: Self-pay | Admitting: Obstetrics and Gynecology

## 2016-11-24 VITALS — BP 103/67 | HR 73 | Wt 127.1 lb

## 2016-11-24 DIAGNOSIS — Z3402 Encounter for supervision of normal first pregnancy, second trimester: Secondary | ICD-10-CM | POA: Diagnosis not present

## 2016-11-24 DIAGNOSIS — Z3482 Encounter for supervision of other normal pregnancy, second trimester: Secondary | ICD-10-CM

## 2016-11-24 DIAGNOSIS — Z1331 Encounter for screening for depression: Secondary | ICD-10-CM

## 2016-11-24 DIAGNOSIS — Z1389 Encounter for screening for other disorder: Secondary | ICD-10-CM

## 2016-11-24 LAB — POCT URINALYSIS DIPSTICK
BILIRUBIN UA: NEGATIVE
GLUCOSE UA: NEGATIVE
Ketones, UA: NEGATIVE
NITRITE UA: NEGATIVE
Protein, UA: NEGATIVE
Spec Grav, UA: <=1.005
Urobilinogen, UA: NEGATIVE
pH, UA: 8

## 2016-11-24 MED ORDER — S-ADENOSYLMETHIONINE 400 MG PO TBEC: 1.0000 | DELAYED_RELEASE_TABLET | Freq: Two times a day (BID) | ORAL | 6 refills | Status: DC

## 2016-11-24 NOTE — Progress Notes (Signed)
ROB: Needs letter for job for work restrictions in pregnancy. Notes that her job does not allow for breaks or even sometimes lunch.  Is working more as someone recently quit.  Also is having to do heavy lifting. Declines flu vaccine. Normal anatomy scan. Is concerned regarding possible depression symptoms. PHQ-9 score is 12.  Discussed management options (phsychotherapy with or without meds).  Patient desires to try OTC supplements. Recommended SAM-E.  RTC in 4 weeks.

## 2016-11-24 NOTE — Patient Instructions (Signed)
S-adenosyl-L-methionine, SAM-e oral dosage forms What is this medicine? S-ADENOSYL-L-METHIONINE, SAM-E (S-a DEN o sil L-me THYE o neen) is a dietary supplement. It is often promoted to help support healthy moods, joints, and liver function. The FDA has not approved this supplement for any medical use. This medicine may be used for other purposes; ask your health care provider or pharmacist if you have questions. What should I tell my health care provider before I take this medicine? They need to know if you have any of these conditions: -if you frequently drink alcohol containing drinks -bipolar illness or mania -depressed mood -immune system problems -liver disease -muscle weakness -neuromuscular or neurologic illness -Parkinson's disease -thoughts of suicide or causing harm to others -an unusual or allergic reaction to SAM-e, other medicines, foods, dyes, or preservatives -pregnant or trying to get pregnant -breast-feeding How should I use this medicine? Take this supplement by mouth with a glass of water. Follow the directions on the package labeling or ask your health care professional. Do not take this supplement more often than directed. Contact your pediatrician regarding the use of this supplement in children. Special care may be needed. Overdosage: If you think you have taken too much of this medicine contact a poison control center or emergency room at once. NOTE: This medicine is only for you. Do not share this medicine with others. What if I miss a dose? If you miss a dose, take it as soon as you can. If it is almost time for your next dose, take only that dose. Do not take double or extra doses. What may interact with this medicine? Do not take this medicine with any of the following medications: -MAOIs like Carbex, Eldepryl, Marplan, Nardil, and Parnate This medicine may also interact with the following medications: -antacids -lithium -medications for Parkinson's disease,  like levodopa -medications for the treatment of depression, anxiety or other mood problems This list may not describe all possible interactions. Give your health care provider a list of all the medicines, herbs, non-prescription drugs, or dietary supplements you use. Also tell them if you smoke, drink alcohol, or use illegal drugs. Some items may interact with your medicine. What should I watch for while using this medicine? See your doctor if your symptoms do not get better or if they get worse. Herbal or dietary supplements are not regulated like medicines. Rigid quality control standards are not required for dietary supplements. The purity and strength of these products can vary. The safety and effect of this dietary supplement for a certain disease or illness is not well known. This product is not intended to diagnose, treat, cure or prevent any disease. The Food and Drug Administration suggests the following to help consumers protect themselves: -Always read product labels and follow directions. -Natural does not mean a product is safe for humans to take. -Look for products that include USP after the ingredient name. This means that the manufacturer followed the standards of the [place] Pharmacopoeia. -Supplements made or sold by a nationally known food or drug company are more likely to be made under tight controls. You can write to the company for more information about how the product was made. What side effects may I notice from receiving this medicine? Side effects that you should report to your doctor or health care professional as soon as possible: -allergic reactions like skin rash, itching or hives, swelling of the face, lips, or tongue -changes in emotions or mood -drooling -restlessness -suicidal thoughts or other mood changes -  tremors -uncontrollable head, mouth, neck, arm, or leg movements -unusually weak or tired Side effects that usually do not require medical attention (report to  your doctor or health care professional if they continue or are bothersome): -heartburn -stomach upset This list may not describe all possible side effects. Call your doctor for medical advice about side effects. You may report side effects to FDA at 1-800-FDA-1088. Where should I keep my medicine? Keep out of the reach of children. Store at room temperature or as directed on the package label. Protect from moisture. Throw away any unused supplement after the expiration date. NOTE: This sheet is a summary. It may not cover all possible information. If you have questions about this medicine, talk to your doctor, pharmacist, or health care provider.  2017 Elsevier/Gold Standard (2015-11-12 09:28:01)

## 2016-12-22 ENCOUNTER — Ambulatory Visit (INDEPENDENT_AMBULATORY_CARE_PROVIDER_SITE_OTHER): Payer: Medicaid Other | Admitting: Obstetrics and Gynecology

## 2016-12-22 ENCOUNTER — Encounter: Payer: Self-pay | Admitting: Obstetrics and Gynecology

## 2016-12-22 VITALS — BP 118/69 | HR 101 | Wt 132.2 lb

## 2016-12-22 DIAGNOSIS — Z131 Encounter for screening for diabetes mellitus: Secondary | ICD-10-CM

## 2016-12-22 DIAGNOSIS — O99612 Diseases of the digestive system complicating pregnancy, second trimester: Secondary | ICD-10-CM

## 2016-12-22 DIAGNOSIS — Z3482 Encounter for supervision of other normal pregnancy, second trimester: Secondary | ICD-10-CM

## 2016-12-22 DIAGNOSIS — K219 Gastro-esophageal reflux disease without esophagitis: Secondary | ICD-10-CM

## 2016-12-22 DIAGNOSIS — Z130 Encounter for screening for diseases of the blood and blood-forming organs and certain disorders involving the immune mechanism: Secondary | ICD-10-CM

## 2016-12-22 LAB — POCT URINALYSIS DIPSTICK
Bilirubin, UA: NEGATIVE
Blood, UA: NEGATIVE
Glucose, UA: NEGATIVE
Ketones, UA: NEGATIVE
Nitrite, UA: NEGATIVE
PROTEIN UA: NEGATIVE
UROBILINOGEN UA: NEGATIVE
pH, UA: 7.5

## 2016-12-22 MED ORDER — RANITIDINE HCL 150 MG PO TABS: 150.0000 mg | ORAL_TABLET | Freq: Two times a day (BID) | ORAL | 4 refills | Status: DC

## 2016-12-22 NOTE — Patient Instructions (Signed)
SAM-E available at [employer] and [employer] for $25.

## 2016-12-22 NOTE — Progress Notes (Signed)
ROB: Patient doing well, no complaints.  Notes currently being on antibiotics for a tooth abscess (Amoxicillin).  Was not able to get SAM-E for mood symptoms due to cost (notes the pharmacy was going to charge her $80).  Advised that it could be gotten over the counter. Now also noting reflux symptoms, not relieved by Tums.  Prescribed Zantac. RTC in 4 weeks.  For 28 week labs at that time.

## 2017-01-19 ENCOUNTER — Encounter: Payer: Self-pay | Admitting: Obstetrics and Gynecology

## 2017-01-19 ENCOUNTER — Other Ambulatory Visit: Payer: Medicaid Other

## 2017-01-19 ENCOUNTER — Ambulatory Visit (INDEPENDENT_AMBULATORY_CARE_PROVIDER_SITE_OTHER): Payer: Medicaid Other | Admitting: Obstetrics and Gynecology

## 2017-01-19 VITALS — BP 109/69 | HR 72 | Wt 138.2 lb

## 2017-01-19 DIAGNOSIS — Z130 Encounter for screening for diseases of the blood and blood-forming organs and certain disorders involving the immune mechanism: Secondary | ICD-10-CM

## 2017-01-19 DIAGNOSIS — Z3493 Encounter for supervision of normal pregnancy, unspecified, third trimester: Secondary | ICD-10-CM

## 2017-01-19 DIAGNOSIS — Z131 Encounter for screening for diabetes mellitus: Secondary | ICD-10-CM

## 2017-01-19 DIAGNOSIS — Z23 Encounter for immunization: Secondary | ICD-10-CM | POA: Diagnosis not present

## 2017-01-19 LAB — POCT URINALYSIS DIPSTICK
Bilirubin, UA: NEGATIVE
Blood, UA: NEGATIVE
Glucose, UA: NEGATIVE
Ketones, UA: NEGATIVE
Leukocytes, UA: NEGATIVE
Nitrite, UA: NEGATIVE
PH UA: 7.5 (ref 5.0–8.0)
PROTEIN UA: NEGATIVE
Spec Grav, UA: 1.01 (ref 1.030–1.035)
Urobilinogen, UA: NEGATIVE (ref ?–2.0)

## 2017-01-19 NOTE — Progress Notes (Signed)
ROB:  Doing 1 hr GCT today - Tdap.  Zantac working.

## 2017-01-20 LAB — CBC
HEMATOCRIT: 37.8 % (ref 34.0–46.6)
Hemoglobin: 13 g/dL (ref 11.1–15.9)
MCH: 31.1 pg (ref 26.6–33.0)
MCHC: 34.4 g/dL (ref 31.5–35.7)
MCV: 90 fL (ref 79–97)
PLATELETS: 320 10*3/uL (ref 150–379)
RBC: 4.18 x10E6/uL (ref 3.77–5.28)
RDW: 13.4 % (ref 12.3–15.4)
WBC: 13.8 10*3/uL — ABNORMAL HIGH (ref 3.4–10.8)

## 2017-01-20 LAB — GLUCOSE, 1 HOUR GESTATIONAL: GESTATIONAL DIABETES SCREEN: 69 mg/dL (ref 65–139)

## 2017-02-02 ENCOUNTER — Ambulatory Visit (INDEPENDENT_AMBULATORY_CARE_PROVIDER_SITE_OTHER): Payer: Medicaid Other | Admitting: Obstetrics and Gynecology

## 2017-02-02 ENCOUNTER — Encounter: Payer: Self-pay | Admitting: Obstetrics and Gynecology

## 2017-02-02 VITALS — BP 105/66 | HR 85 | Wt 140.1 lb

## 2017-02-02 DIAGNOSIS — Z3493 Encounter for supervision of normal pregnancy, unspecified, third trimester: Secondary | ICD-10-CM

## 2017-02-02 NOTE — Progress Notes (Signed)
ROB:  Pt doing well.  Says friends have lice - exam = no lice today.  Size < dates - follow fundal hts.  Consider U/S as indicated.

## 2017-02-21 ENCOUNTER — Ambulatory Visit (INDEPENDENT_AMBULATORY_CARE_PROVIDER_SITE_OTHER): Payer: Medicaid Other | Admitting: Obstetrics and Gynecology

## 2017-02-21 VITALS — BP 118/67 | HR 105 | Wt 146.0 lb

## 2017-02-21 DIAGNOSIS — R825 Elevated urine levels of drugs, medicaments and biological substances: Secondary | ICD-10-CM

## 2017-02-21 DIAGNOSIS — Z3483 Encounter for supervision of other normal pregnancy, third trimester: Secondary | ICD-10-CM

## 2017-02-21 DIAGNOSIS — O2603 Excessive weight gain in pregnancy, third trimester: Secondary | ICD-10-CM

## 2017-02-21 LAB — POCT URINALYSIS DIPSTICK
Bilirubin, UA: NEGATIVE
Glucose, UA: NEGATIVE
Ketones, UA: NEGATIVE
NITRITE UA: NEGATIVE
PH UA: 6.5 (ref 5.0–8.0)
PROTEIN UA: NEGATIVE
Spec Grav, UA: >=1.03 — AB (ref 1.010–1.025)
UROBILINOGEN UA: 0.2 U/dL

## 2017-02-21 NOTE — Progress Notes (Signed)
ROB: Swelling in feet and hands.  BPs wnl. Discussed elevating legs.  Also noting Braxton hicks. To perform random UDS for h/o marijuana use.   Discussed monitoring and no further weight gain in pregnancy (TWG 46 lbs).  Discussed exercise in pregnancy. RTC in 2 weeks.

## 2017-02-23 LAB — PAIN MGT SCRN (14 DRUGS), UR
Amphetamine Scrn, Ur: NEGATIVE ng/mL
BARBITURATE SCREEN URINE: NEGATIVE ng/mL
BENZODIAZEPINE SCREEN, URINE: NEGATIVE ng/mL
Buprenorphine, Urine: NEGATIVE ng/mL
CANNABINOIDS UR QL SCN: POSITIVE ng/mL
CREATININE(CRT), U: 117 mg/dL (ref 20.0–300.0)
Cocaine (Metab) Scrn, Ur: NEGATIVE ng/mL
Fentanyl, Urine: NEGATIVE pg/mL
METHADONE SCREEN, URINE: NEGATIVE ng/mL
Meperidine Screen, Urine: NEGATIVE ng/mL
OXYCODONE+OXYMORPHONE UR QL SCN: NEGATIVE ng/mL
Opiate Scrn, Ur: NEGATIVE ng/mL
PH UR, DRUG SCRN: 6.2 (ref 4.5–8.9)
Phencyclidine Qn, Ur: NEGATIVE ng/mL
Propoxyphene Scrn, Ur: NEGATIVE ng/mL
Tramadol Screen, Urine: NEGATIVE ng/mL

## 2017-03-07 ENCOUNTER — Ambulatory Visit (INDEPENDENT_AMBULATORY_CARE_PROVIDER_SITE_OTHER): Payer: Medicaid Other | Admitting: Obstetrics and Gynecology

## 2017-03-07 VITALS — BP 111/61 | HR 85 | Wt 148.7 lb

## 2017-03-07 DIAGNOSIS — Z3483 Encounter for supervision of other normal pregnancy, third trimester: Secondary | ICD-10-CM

## 2017-03-07 DIAGNOSIS — F1290 Cannabis use, unspecified, uncomplicated: Secondary | ICD-10-CM

## 2017-03-07 LAB — POCT URINALYSIS DIPSTICK
Bilirubin, UA: NEGATIVE
Blood, UA: NEGATIVE
GLUCOSE UA: NEGATIVE
KETONES UA: NEGATIVE
LEUKOCYTES UA: NEGATIVE
NITRITE UA: NEGATIVE
PH UA: 7 (ref 5.0–8.0)
PROTEIN UA: NEGATIVE
Spec Grav, UA: <=1.005 — AB (ref 1.010–1.025)
UROBILINOGEN UA: 0.2 U/dL

## 2017-03-07 NOTE — Progress Notes (Deleted)
ROB

## 2017-03-07 NOTE — Progress Notes (Signed)
ROB: Patient doing well, no complaints. Discussed pain management in labor, all questions answered.  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  RTC in 1 week.  For 36 week labs at that time (patient states she prefers female provider for next visit due to requiring an exam).

## 2017-03-14 ENCOUNTER — Ambulatory Visit (INDEPENDENT_AMBULATORY_CARE_PROVIDER_SITE_OTHER): Payer: Medicaid Other | Admitting: Obstetrics and Gynecology

## 2017-03-14 VITALS — BP 113/73 | HR 94 | Wt 152.1 lb

## 2017-03-14 DIAGNOSIS — Z113 Encounter for screening for infections with a predominantly sexual mode of transmission: Secondary | ICD-10-CM

## 2017-03-14 DIAGNOSIS — Z3685 Encounter for antenatal screening for Streptococcus B: Secondary | ICD-10-CM

## 2017-03-14 DIAGNOSIS — O2603 Excessive weight gain in pregnancy, third trimester: Secondary | ICD-10-CM | POA: Insufficient documentation

## 2017-03-14 DIAGNOSIS — Z3483 Encounter for supervision of other normal pregnancy, third trimester: Secondary | ICD-10-CM

## 2017-03-14 LAB — POCT URINALYSIS DIPSTICK
BILIRUBIN UA: NEGATIVE
GLUCOSE UA: NEGATIVE
Ketones, UA: NEGATIVE
Nitrite, UA: NEGATIVE
PROTEIN UA: NEGATIVE
RBC UA: NEGATIVE
Spec Grav, UA: 1.02 (ref 1.010–1.025)
UROBILINOGEN UA: 0.2 U/dL
pH, UA: 6 (ref 5.0–8.0)

## 2017-03-14 NOTE — Progress Notes (Signed)
ROB: Notes Kendra PeltonBraxton Hicks. Discussed labor plans, unsure about epidural but strongly considering.  Discussed delayed cord clamping.  Strongly again encouraged no further weight gain (TG 52 lbs thus far). Patient notes she had a birthday recently and "overdid it". 36 week labs done today. RTC in 1 week.

## 2017-03-18 LAB — GC/CHLAMYDIA PROBE AMP
CHLAMYDIA, DNA PROBE: NEGATIVE
Neisseria gonorrhoeae by PCR: NEGATIVE

## 2017-03-19 LAB — CULTURE, BETA STREP (GROUP B ONLY): Strep Gp B Culture: NEGATIVE

## 2017-03-21 ENCOUNTER — Encounter: Payer: Self-pay | Admitting: Obstetrics and Gynecology

## 2017-03-21 ENCOUNTER — Ambulatory Visit (INDEPENDENT_AMBULATORY_CARE_PROVIDER_SITE_OTHER): Payer: Medicaid Other | Admitting: Obstetrics and Gynecology

## 2017-03-21 VITALS — BP 119/79 | HR 87 | Wt 153.3 lb

## 2017-03-21 DIAGNOSIS — Z3493 Encounter for supervision of normal pregnancy, unspecified, third trimester: Secondary | ICD-10-CM

## 2017-03-21 DIAGNOSIS — K5900 Constipation, unspecified: Secondary | ICD-10-CM

## 2017-03-21 DIAGNOSIS — O2603 Excessive weight gain in pregnancy, third trimester: Secondary | ICD-10-CM

## 2017-03-21 LAB — POCT URINALYSIS DIPSTICK
Bilirubin, UA: NEGATIVE
GLUCOSE UA: NEGATIVE
Ketones, UA: NEGATIVE
Leukocytes, UA: NEGATIVE
Nitrite, UA: NEGATIVE
Protein, UA: NEGATIVE
RBC UA: NEGATIVE
Spec Grav, UA: 1.02 (ref 1.010–1.025)
UROBILINOGEN UA: 0.2 U/dL
pH, UA: 5 (ref 5.0–8.0)

## 2017-03-21 MED ORDER — RANITIDINE HCL 150 MG PO TABS: 150.0000 mg | ORAL_TABLET | Freq: Two times a day (BID) | ORAL | 4 refills | Status: DC

## 2017-03-21 NOTE — Progress Notes (Signed)
ROB:  Discussed constipation and fiber.  Starting to do better.  Wt gain reviewed.  S&S of labor discussed.  Fetal mvt discussed.

## 2017-03-28 ENCOUNTER — Ambulatory Visit (INDEPENDENT_AMBULATORY_CARE_PROVIDER_SITE_OTHER): Payer: Medicaid Other | Admitting: Obstetrics and Gynecology

## 2017-03-28 VITALS — BP 124/76 | HR 80 | Wt 153.6 lb

## 2017-03-28 DIAGNOSIS — Z3483 Encounter for supervision of other normal pregnancy, third trimester: Secondary | ICD-10-CM

## 2017-03-28 LAB — POCT URINALYSIS DIPSTICK
BILIRUBIN UA: NEGATIVE
Blood, UA: NEGATIVE
Glucose, UA: NEGATIVE
Ketones, UA: NEGATIVE
LEUKOCYTES UA: NEGATIVE
Nitrite, UA: NEGATIVE
PH UA: 6.5 (ref 5.0–8.0)
Spec Grav, UA: 1.01 (ref 1.010–1.025)
Urobilinogen, UA: 0.2 E.U./dL

## 2017-03-28 NOTE — Progress Notes (Signed)
ROB: Doing well. Noting Kendra PeltonBraxton Hicks with occasional "real contractions". Reiterated labor precautions. RTC in 1 week.

## 2017-04-03 ENCOUNTER — Inpatient Hospital Stay: Payer: Medicaid Other | Admitting: Certified Registered Nurse Anesthetist

## 2017-04-03 ENCOUNTER — Inpatient Hospital Stay
Admission: EM | Admit: 2017-04-03 | Discharge: 2017-04-05 | DRG: 775 | Disposition: A | Discharge status: Home / Self Care | Payer: Medicaid Other | Attending: Certified Nurse Midwife | Admitting: Certified Nurse Midwife

## 2017-04-03 DIAGNOSIS — K219 Gastro-esophageal reflux disease without esophagitis: Secondary | ICD-10-CM | POA: Diagnosis present

## 2017-04-03 DIAGNOSIS — O4292 Full-term premature rupture of membranes, unspecified as to length of time between rupture and onset of labor: Secondary | ICD-10-CM | POA: Diagnosis present

## 2017-04-03 DIAGNOSIS — Z87891 Personal history of nicotine dependence: Secondary | ICD-10-CM | POA: Diagnosis not present

## 2017-04-03 DIAGNOSIS — O2603 Excessive weight gain in pregnancy, third trimester: Secondary | ICD-10-CM | POA: Diagnosis present

## 2017-04-03 DIAGNOSIS — O9962 Diseases of the digestive system complicating childbirth: Secondary | ICD-10-CM | POA: Diagnosis present

## 2017-04-03 DIAGNOSIS — Z3A39 39 weeks gestation of pregnancy: Secondary | ICD-10-CM | POA: Diagnosis not present

## 2017-04-03 DIAGNOSIS — O326XX0 Maternal care for compound presentation, not applicable or unspecified: Secondary | ICD-10-CM | POA: Diagnosis present

## 2017-04-03 LAB — URINE DRUG SCREEN, QUALITATIVE (ARMC ONLY)
Amphetamines, Ur Screen: NOT DETECTED
BARBITURATES, UR SCREEN: NOT DETECTED
BENZODIAZEPINE, UR SCRN: NOT DETECTED
CANNABINOID 50 NG, UR ~~LOC~~: POSITIVE — AB
COCAINE METABOLITE, UR ~~LOC~~: NOT DETECTED
MDMA (Ecstasy)Ur Screen: NOT DETECTED
METHADONE SCREEN, URINE: NOT DETECTED
OPIATE, UR SCREEN: NOT DETECTED
Phencyclidine (PCP) Ur S: NOT DETECTED
TRICYCLIC, UR SCREEN: NOT DETECTED

## 2017-04-03 LAB — TYPE AND SCREEN
ABO/RH(D): O POS
ANTIBODY SCREEN: NEGATIVE

## 2017-04-03 LAB — CBC
HEMATOCRIT: 39.7 % (ref 35.0–47.0)
Hemoglobin: 13.8 g/dL (ref 12.0–16.0)
MCH: 32.3 pg (ref 26.0–34.0)
MCHC: 34.7 g/dL (ref 32.0–36.0)
MCV: 93.1 fL (ref 80.0–100.0)
PLATELETS: 251 10*3/uL (ref 150–440)
RBC: 4.26 MIL/uL (ref 3.80–5.20)
RDW: 13.6 % (ref 11.5–14.5)
WBC: 15.5 10*3/uL — ABNORMAL HIGH (ref 3.6–11.0)

## 2017-04-03 MED ORDER — PHENYLEPHRINE 40 MCG/ML (10ML) SYRINGE FOR IV PUSH (FOR BLOOD PRESSURE SUPPORT): 80.0000 ug | PREFILLED_SYRINGE | INTRAVENOUS | Status: DC | PRN

## 2017-04-03 MED ORDER — FENTANYL 2.5 MCG/ML W/ROPIVACAINE 0.2% IN NS 100 ML EPIDURAL INFUSION (ARMC-ANES): 10.0000 mL/h | EPIDURAL | Status: DC

## 2017-04-03 MED ORDER — ACETAMINOPHEN 325 MG PO TABS
650.0000 mg | ORAL_TABLET | ORAL | Status: DC | PRN
  Administered 2017-04-04 – 2017-04-05 (×5): 650 mg via ORAL
  Filled 2017-04-03 (×6): qty 2

## 2017-04-03 MED ORDER — BUTORPHANOL TARTRATE 1 MG/ML IJ SOLN
1.0000 mg | INTRAMUSCULAR | Status: DC | PRN
  Administered 2017-04-03: 1 mg via INTRAVENOUS

## 2017-04-03 MED ORDER — BUPIVACAINE HCL (PF) 0.25 % IJ SOLN
INTRAMUSCULAR | Status: DC | PRN
  Administered 2017-04-03 (×2): 5 mL via EPIDURAL

## 2017-04-03 MED ORDER — LIDOCAINE HCL (PF) 1 % IJ SOLN
INTRAMUSCULAR | Status: AC
  Filled 2017-04-03: qty 30

## 2017-04-03 MED ORDER — ZOLPIDEM TARTRATE 5 MG PO TABS: 5.0000 mg | ORAL_TABLET | Freq: Every evening | ORAL | Status: DC | PRN

## 2017-04-03 MED ORDER — OXYTOCIN 10 UNIT/ML IJ SOLN
INTRAMUSCULAR | Status: AC
  Filled 2017-04-03: qty 2

## 2017-04-03 MED ORDER — LIDOCAINE-EPINEPHRINE (PF) 1.5 %-1:200000 IJ SOLN
INTRAMUSCULAR | Status: DC | PRN
  Administered 2017-04-03: 3 mL via PERINEURAL

## 2017-04-03 MED ORDER — ONDANSETRON HCL 4 MG PO TABS: 4.0000 mg | ORAL_TABLET | ORAL | Status: DC | PRN

## 2017-04-03 MED ORDER — EPHEDRINE 5 MG/ML INJ: 10.0000 mg | INTRAVENOUS | Status: DC | PRN

## 2017-04-03 MED ORDER — IBUPROFEN 800 MG PO TABS
800.0000 mg | ORAL_TABLET | Freq: Four times a day (QID) | ORAL | Status: DC
  Administered 2017-04-03 – 2017-04-05 (×8): 800 mg via ORAL
  Filled 2017-04-03 (×8): qty 1

## 2017-04-03 MED ORDER — OXYTOCIN 40 UNITS IN LACTATED RINGERS INFUSION - SIMPLE MED
2.5000 [IU]/h | INTRAVENOUS | Status: DC
  Filled 2017-04-03: qty 1000

## 2017-04-03 MED ORDER — AMMONIA AROMATIC IN INHA
RESPIRATORY_TRACT | Status: AC
  Filled 2017-04-03: qty 10

## 2017-04-03 MED ORDER — DIBUCAINE 1 % RE OINT: 1.0000 "application " | TOPICAL_OINTMENT | RECTAL | Status: DC | PRN

## 2017-04-03 MED ORDER — OXYTOCIN 40 UNITS IN LACTATED RINGERS INFUSION - SIMPLE MED
1.0000 m[IU]/min | INTRAVENOUS | Status: DC
  Administered 2017-04-03: 1 m[IU]/min via INTRAVENOUS

## 2017-04-03 MED ORDER — LACTATED RINGERS IV SOLN: INTRAVENOUS | Status: DC

## 2017-04-03 MED ORDER — ONDANSETRON HCL 4 MG/2ML IJ SOLN: 4.0000 mg | Freq: Four times a day (QID) | INTRAMUSCULAR | Status: DC | PRN

## 2017-04-03 MED ORDER — LACTATED RINGERS IV SOLN
500.0000 mL | INTRAVENOUS | Status: DC | PRN
  Administered 2017-04-03: 1000 mL via INTRAVENOUS

## 2017-04-03 MED ORDER — BENZOCAINE-MENTHOL 20-0.5 % EX AERO: 1.0000 "application " | INHALATION_SPRAY | CUTANEOUS | Status: DC | PRN

## 2017-04-03 MED ORDER — SIMETHICONE 80 MG PO CHEW: 80.0000 mg | CHEWABLE_TABLET | ORAL | Status: DC | PRN

## 2017-04-03 MED ORDER — BUTORPHANOL TARTRATE 2 MG/ML IJ SOLN
INTRAMUSCULAR | Status: AC
  Administered 2017-04-03: 1 mg
  Filled 2017-04-03: qty 2

## 2017-04-03 MED ORDER — TERBUTALINE SULFATE 1 MG/ML IJ SOLN: 0.2500 mg | Freq: Once | INTRAMUSCULAR | Status: DC | PRN

## 2017-04-03 MED ORDER — SOD CITRATE-CITRIC ACID 500-334 MG/5ML PO SOLN
30.0000 mL | ORAL | Status: DC | PRN
  Filled 2017-04-03: qty 30

## 2017-04-03 MED ORDER — MISOPROSTOL 200 MCG PO TABS
ORAL_TABLET | ORAL | Status: AC
  Filled 2017-04-03: qty 4

## 2017-04-03 MED ORDER — WITCH HAZEL-GLYCERIN EX PADS: 1.0000 "application " | MEDICATED_PAD | CUTANEOUS | Status: DC | PRN

## 2017-04-03 MED ORDER — FENTANYL 2.5 MCG/ML W/ROPIVACAINE 0.2% IN NS 100 ML EPIDURAL INFUSION (ARMC-ANES)
EPIDURAL | Status: DC | PRN
  Administered 2017-04-03: 10 mL/h via EPIDURAL

## 2017-04-03 MED ORDER — LIDOCAINE HCL (PF) 1 % IJ SOLN
INTRAMUSCULAR | Status: DC | PRN
  Administered 2017-04-03: 3 mL

## 2017-04-03 MED ORDER — OXYTOCIN BOLUS FROM INFUSION: 500.0000 mL | Freq: Once | INTRAVENOUS | Status: DC

## 2017-04-03 MED ORDER — FENTANYL 2.5 MCG/ML W/ROPIVACAINE 0.2% IN NS 100 ML EPIDURAL INFUSION (ARMC-ANES)
EPIDURAL | Status: AC
  Filled 2017-04-03: qty 100

## 2017-04-03 MED ORDER — SENNOSIDES-DOCUSATE SODIUM 8.6-50 MG PO TABS
2.0000 | ORAL_TABLET | ORAL | Status: DC
  Administered 2017-04-03 – 2017-04-05 (×2): 2 via ORAL
  Filled 2017-04-03 (×2): qty 2

## 2017-04-03 MED ORDER — LIDOCAINE HCL (PF) 1 % IJ SOLN: 30.0000 mL | INTRAMUSCULAR | Status: DC | PRN

## 2017-04-03 MED ORDER — DIPHENHYDRAMINE HCL 25 MG PO CAPS: 25.0000 mg | ORAL_CAPSULE | Freq: Four times a day (QID) | ORAL | Status: DC | PRN

## 2017-04-03 MED ORDER — ACETAMINOPHEN 325 MG PO TABS: 650.0000 mg | ORAL_TABLET | ORAL | Status: DC | PRN

## 2017-04-03 MED ORDER — ONDANSETRON HCL 4 MG/2ML IJ SOLN: 4.0000 mg | INTRAMUSCULAR | Status: DC | PRN

## 2017-04-03 MED ORDER — LACTATED RINGERS IV SOLN: 500.0000 mL | Freq: Once | INTRAVENOUS | Status: DC

## 2017-04-03 MED ORDER — DIPHENHYDRAMINE HCL 50 MG/ML IJ SOLN: 12.5000 mg | INTRAMUSCULAR | Status: DC | PRN

## 2017-04-03 MED ORDER — PRENATAL MULTIVITAMIN CH
1.0000 | ORAL_TABLET | Freq: Every day | ORAL | Status: DC
  Administered 2017-04-04: 1 via ORAL
  Filled 2017-04-03: qty 1

## 2017-04-03 MED ORDER — COCONUT OIL OIL
1.0000 "application " | TOPICAL_OIL | Status: DC | PRN
  Administered 2017-04-03: 1 via TOPICAL
  Filled 2017-04-03: qty 120

## 2017-04-03 NOTE — Lactation Note (Addendum)
This note was copied from a baby's chart. Lactation Consultation Note Mom had positive MJ tox blood screen 09/12/2016 and had positive MJ urine screen 02/21/2017 and 04/03/2017.  Discussed recommendations for pumping and dumping milk until mom re tested and was negative for marijuana  Directly after delivery discussed risks of MJ to baby.   Mom reports only doing a little for nausea.  Even after risks discussed mom was adamant about baby getting colostrum.  Baby placed skin to skin and immediately began rooting towards left breast.  Mom's right nipple inverts upon stimulation.  Mom's left nipple is slightly flat.  Jasper latched to left breast, but only took a couple of weak sucks.  No swallows noted.  Mom said she wanted to pump.  Discussed mom's wishes with Dr. Rachel BoMertz, Pediatrician on call.  As soon as mom came to mother baby unit room 342, Symphony DEBP set up in room.  Assisted mom with pumping and once again recommended mom pump and dump for now.  Urine bag placed on baby for urine drug screen.  When returned to check on mom again, she had baby latched on to left breast.  Mom also had (+)PHQ-9 rating for depression. Patient Name: Kendra Viviano SimasSarah Carey VHQIO'NToday's Date: 04/03/2017 Reason for consult: Follow-up assessment   Maternal Data Formula Feeding for Exclusion: No Has patient been taught Hand Expression?: Yes Does the patient have breastfeeding experience prior to this delivery?: No  Feeding Feeding Type: Breast Fed Length of feed:  (Took a few sucks from left breast after pumping)  LATCH Score/Interventions                      Lactation Tools Discussed/Used Tools: Pump Breast pump type: Double-Electric Breast Pump WIC Program: Yes Pump Review: Setup, frequency, and cleaning;Milk Storage Initiated by:: S.Helma Argyle,RN,BSN,IBCLC Date initiated:: 04/03/17   Consult Status Consult Status: Follow-up Date: 04/04/17 Follow-up type: In-patient    Kendra MeckelWilliams, Kendra Carey 04/03/2017,  10:55 PM

## 2017-04-03 NOTE — Anesthesia Procedure Notes (Signed)
Epidural Patient location during procedure: OB Start time: 04/03/2017 10:26 AM End time: 04/03/2017 10:42 AM  Staffing Anesthesiologist: Yves DillARROLL, PAUL Resident/CRNA: Ginger CarneMICHELET, Garima Chronis Performed: resident/CRNA   Preanesthetic Checklist Completed: patient identified, site marked, surgical consent, pre-op evaluation, timeout performed, IV checked, risks and benefits discussed and monitors and equipment checked  Epidural Patient position: sitting Prep: Betadine Patient monitoring: heart rate, continuous pulse ox and blood pressure Approach: midline Location: L4-L5 Injection technique: LOR saline  Needle:  Needle type: Tuohy  Needle gauge: 17 G Needle length: 9 cm and 9 Needle insertion depth: 6 cm Catheter type: closed end flexible Catheter size: 19 Gauge Catheter at skin depth: 12 cm Test dose: negative and 1.5% lidocaine with Epi 1:200 K  Assessment Sensory level: T10 Events: blood not aspirated, injection not painful, no injection resistance, negative IV test and no paresthesia  Additional Notes Pt. Evaluated and documentation done after procedure finished. Patient identified. Risks/Benefits/Options discussed with patient including but not limited to bleeding, infection, nerve damage, paralysis, failed block, incomplete pain control, headache, blood pressure changes, nausea, vomiting, reactions to medication both or allergic, itching and postpartum back pain. Confirmed with bedside nurse the patient's most recent platelet count. Confirmed with patient that they are not currently taking any anticoagulation, have any bleeding history or any family history of bleeding disorders. Patient expressed understanding and wished to proceed. All questions were answered. Sterile technique was used throughout the entire procedure. Please see nursing notes for vital signs. Test dose was given through epidural catheter and negative prior to continuing to dose epidural or start infusion. Warning  signs of high block given to the patient including shortness of breath, tingling/numbness in hands, complete motor block, or any concerning symptoms with instructions to call for help. Patient was given instructions on fall risk and not to get out of bed. All questions and concerns addressed with instructions to call with any issues or inadequate analgesia.   Patient tolerated the insertion well without immediate complications.Reason for block:procedure for pain

## 2017-04-03 NOTE — H&P (Signed)
History and Physical   HPI  Kendra Carey is a 21 y.o. G2P0010 at [redacted]w[redacted]d Estimated Date of Delivery: 04/08/17 who is being admitted for  PROM.  Occurrence @ midnight.  Not feeling contractions.  OB History  Obstetric History   G2   P0   T0   P0   A1   L0    SAB1   TAB0   Ectopic0   Multiple0   Live Births0     # Outcome Date GA Lbr Len/2nd Weight Sex Delivery Anes PTL Lv  2 Current           1 SAB 08/2014 [redacted]w[redacted]d           Obstetric Comments  2015-pregnancy 10wks but stopped development at 7wk    PROBLEM LIST  Pregnancy complications or risks: Patient Active Problem List   Diagnosis Date Noted  . Labor and delivery indication for care or intervention 04/03/2017  . Excessive weight gain during pregnancy in third trimester 03/14/2017  . Gastroesophageal reflux in pregnancy in second trimester 12/22/2016  . Positive depression screening 11/24/2016  . H/O miscarriage, currently pregnant 10/02/2016  . Positive urine drug screen 10/02/2016  . GERD (gastroesophageal reflux disease) 09/12/2016  . History of stomach ulcers 09/12/2016  . Amenorrhea 09/12/2016    Prenatal labs and studies: ABO, Rh: --/--/O POS (06/11 1610) Antibody: NEG (06/11 0339) Rubella: 1.89 (11/20 1630) RPR: Non Reactive (11/20 1630)  HBsAg: Negative (11/20 1630)  HIV: Non Reactive (11/20 1630)  GBS: Negative  History reviewed. No pertinent past medical history.   Past Surgical History:  Procedure Laterality Date  . DILATION AND CURETTAGE OF UTERUS  2015   SAB  . WISDOM TOOTH EXTRACTION Right 2018     Medications    Current Discharge Medication List    CONTINUE these medications which have NOT CHANGED   Details  Prenat-FeCbn-FeAsp-Meth-FA-DHA (PRENATE MINI) 18-0.6-0.4-350 MG CAPS Take 18 mg by mouth daily. Qty: 30 capsule, Refills: 11   Associated Diagnoses: Supervision of normal first pregnancy in second trimester    ranitidine (ZANTAC) 150 MG tablet Take 1 tablet (150 mg total) by  mouth 2 (two) times daily. Qty: 60 tablet, Refills: 4         Allergies  Patient has no known allergies.  Review of Systems  Pertinent items noted in HPI and remainder of comprehensive ROS otherwise negative.  Physical Exam  BP 115/72 (BP Location: Right Arm)   Pulse (!) 57   Temp 98 F (36.7 C) (Oral)   Resp 18   Ht 5\' 3"  (1.6 m)   Wt 156 lb (70.8 kg)   LMP 07/02/2016   BMI 27.63 kg/m   Lungs:  CTA B Cardio: RRR without M/R/G Abd: Soft, gravid, NT Presentation: cephalic EXT: No C/C/ 1+ Edema DTRs: 2+ B CERVIX: 2 cm:75%: -3:   See Prenatal records for more detailed PE.    FHR:  Variability: Good {> 6 bpm)  Toco: Uterine Contractions: irregular mild 2-5 minutes  Test Results  Results for orders placed or performed during the hospital encounter of 04/03/17 (from the past 24 hour(s))  CBC     Status: Abnormal   Collection Time: 04/03/17  3:39 AM  Result Value Ref Range   WBC 15.5 (H) 3.6 - 11.0 K/uL   RBC 4.26 3.80 - 5.20 MIL/uL   Hemoglobin 13.8 12.0 - 16.0 g/dL   HCT 96.0 45.4 - 09.8 %   MCV 93.1 80.0 - 100.0 fL  MCH 32.3 26.0 - 34.0 pg   MCHC 34.7 32.0 - 36.0 g/dL   RDW 16.113.6 09.611.5 - 04.514.5 %   Platelets 251 150 - 440 K/uL  Type and screen [facility]     Status: None   Collection Time: 04/03/17  3:39 AM  Result Value Ref Range   ABO/RH(D) O POS    Antibody Screen NEG    Sample Expiration 04/06/2017   Urine Drug Screen, Qualitative ([facility] only)     Status: Abnormal   Collection Time: 04/03/17  3:39 AM  Result Value Ref Range   Tricyclic, Ur Screen NONE DETECTED NONE DETECTED   Amphetamines, Ur Screen NONE DETECTED NONE DETECTED   MDMA (Ecstasy)Ur Screen NONE DETECTED NONE DETECTED   Cocaine Metabolite,Ur [place] NONE DETECTED NONE DETECTED   Opiate, Ur Screen NONE DETECTED NONE DETECTED   Phencyclidine (PCP) Ur S NONE DETECTED NONE DETECTED   Cannabinoid 50 Ng, Ur [place] POSITIVE (A) NONE DETECTED   Barbiturates, Ur Screen NONE  DETECTED NONE DETECTED   Benzodiazepine, Ur Scrn NONE DETECTED NONE DETECTED   Methadone Scn, Ur NONE DETECTED NONE DETECTED   Group B Strep negative  Assessment   G2P0010 at 1885w2d Estimated Date of Delivery: 04/08/17  The fetus is reassuring.   Patient Active Problem List   Diagnosis Date Noted  . Labor and delivery indication for care or intervention 04/03/2017  . Excessive weight gain during pregnancy in third trimester 03/14/2017  . Gastroesophageal reflux in pregnancy in second trimester 12/22/2016  . Positive depression screening 11/24/2016  . H/O miscarriage, currently pregnant 10/02/2016  . Positive urine drug screen 10/02/2016  . GERD (gastroesophageal reflux disease) 09/12/2016  . History of stomach ulcers 09/12/2016  . Amenorrhea 09/12/2016    Plan  1. Admit to L&D for IV Pitocin augmentation/induction for PROM 2. EFM: -- Category 1 3. Epidural if desired. Stadol for IV pain until epidural requested. 4. Admission labs   Elonda Huskyavid J. Shatika Grinnell, M.D. 04/03/2017 8:25 AM

## 2017-04-03 NOTE — Anesthesia Preprocedure Evaluation (Addendum)
Anesthesia Evaluation  Patient identified by MRN, date of birth, ID band Patient awake    Reviewed: Allergy & Precautions, H&P , NPO status , Patient's Chart, lab work & pertinent test results  History of Anesthesia Complications Negative for: history of anesthetic complications  Airway    Neck ROM: full  Mouth opening: Limited Mouth Opening  Dental  (+) Teeth Intact   Pulmonary former smoker,    Pulmonary exam normal        Cardiovascular Exercise Tolerance: Good negative cardio ROS Normal cardiovascular exam     Neuro/Psych    GI/Hepatic Neg liver ROS, GERD  Medicated and Controlled,  Endo/Other  negative endocrine ROS  Renal/GU negative Renal ROS     Musculoskeletal   Abdominal   Peds  Hematology negative hematology ROS (+)   Anesthesia Other Findings   Reproductive/Obstetrics (+) Pregnancy                             Anesthesia Physical Anesthesia Plan  ASA: II  Anesthesia Plan: Epidural   Post-op Pain Management:    Induction:   PONV Risk Score and Plan:   Airway Management Planned:   Additional Equipment:   Intra-op Plan:   Post-operative Plan:   Informed Consent: I have reviewed the patients History and Physical, chart, labs and discussed the procedure including the risks, benefits and alternatives for the proposed anesthesia with the patient or authorized representative who has indicated his/her understanding and acceptance.     Plan Discussed with: Anesthesiologist  Anesthesia Plan Comments:         Anesthesia Quick Evaluation

## 2017-04-03 NOTE — Progress Notes (Signed)
Intrapartum Progress Note  S: Patient comfortable, s/p epidural.   O: Blood pressure 115/72, pulse (!) 57, temperature 97.7 F (36.5 C), temperature source Oral, resp. rate 18, height 5\' 3"  (1.6 m), weight 156 lb (70.8 kg), last menstrual period 07/02/2016. Gen App: NAD, comfortable Abdomen: soft, gravid FHT: baseline 125 bpm.  Accels present.  Decels absent. moderate in degree variability.   Tocometer: contractions q 3-4 minutes Cervix: 9/100/0 to +1 Extremities: Nontender, no edema.  Pitocin: 6 mIU  Labs:   No new labs  Assessment:  1: SIUP at 6215w2d 2. SROM at ~ 1200 a.m. this morning  Plan:  1. Continue with active management of labor (augmentation with pitocin) 2. Anticipate vaginal delivery soon 3. Monitor for s/s of chorioamnionitis.    Hildred Laserherry, Dayanne Yiu, MD 04/03/2017 1:09 PM

## 2017-04-04 ENCOUNTER — Encounter: Payer: Medicaid Other | Admitting: Obstetrics and Gynecology

## 2017-04-04 LAB — CBC
HEMATOCRIT: 35.3 % (ref 35.0–47.0)
Hemoglobin: 12.1 g/dL (ref 12.0–16.0)
MCH: 32 pg (ref 26.0–34.0)
MCHC: 34.3 g/dL (ref 32.0–36.0)
MCV: 93.1 fL (ref 80.0–100.0)
Platelets: 230 10*3/uL (ref 150–440)
RBC: 3.79 MIL/uL — AB (ref 3.80–5.20)
RDW: 14 % (ref 11.5–14.5)
WBC: 16.5 10*3/uL — ABNORMAL HIGH (ref 3.6–11.0)

## 2017-04-04 LAB — CHLAMYDIA/NGC RT PCR (ARMC ONLY)
Chlamydia Tr: NOT DETECTED
N gonorrhoeae: NOT DETECTED

## 2017-04-04 LAB — RPR: RPR Ser Ql: NONREACTIVE

## 2017-04-04 NOTE — Clinical Social Work Maternal (Addendum)
  CLINICAL SOCIAL WORK MATERNAL/CHILD NOTE  Patient Details  Name: Kendra Carey MRN: [id number] Date of Birth: [birth year]  Date:  04/04/2017  Clinical Social Worker Initiating Note:   Mel Almond Hoke Baer, [place] [phone] ) Date/ Time Initiated:  04/04/17/1457     Child's Name:   Kendra Carey )   Legal Guardian:  Mother   Need for Interpreter:  None   Date of Referral:  04/04/17     Reason for Referral:  Current Substance Use/Substance Use During Pregnancy    Referral Source:  RN   Address:   ([place] [address])  Phone number:   [phone])   Household Members:  Parents   Natural Supports (not living in the home):  Extended Family, Friends, Immediate Family, Spouse/significant other   Professional Supports: Other (Comment) (WIC, Medicaid )   Employment: Full-time   Type of Work:   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   Education:  Chiropractor Resources:  Medicaid   Other Resources:  [facility]   Cultural/Religious Considerations Which May Impact Care:  N/A  Strengths:  Ability to meet basic needs , Compliance with medical plan , Home prepared for child    Risk Factors/Current Problems:  Substance Use    Cognitive State:  Insightful , Linear Thinking    Mood/Affect:  Animated, Happy , Calm    CSW Assessment: Clinical Education officer, museum (CSW) received consult for mother and infant both having a positive urine tox screen for marijuana. Per RN mother and family members have been appropriate. CSW met with mother and infant alone at bedside. Per mother this is her first child and she lives in [place] with her father Kendra Carey. Mother reported that the father of the baby is Kendra Carey and they are in a relationship and he is very involved with the infant. Mother reported that she is not experiencing abuse from anyone. Mother reported that she works full time at a [employer] in [place]. Mother reported  that she has strong family support and all the baby supplies needed, including a car seat. CSW made mother aware that she and infant had a positive urine tox screen for marijuana. Mother reported that she used marijuana twice during her pregnancy because she has having anxiety and was feel sick to her stomach. CSW provided mother with a list of outpatient mental health and substance abuse resources in [place]. Mother reported that she does have a history of anxiety and depression but reported that she is currently feeling happy and not having any symptoms of anxiety and depression. CSW provided mother with education about postpartum depression and encouraged her to get established with a PCP. Per mother she is taking her infant to Dr. Barbera Setters. CSW made mother aware that a CPS report will have to be made. Mother verbalized her understanding. Please reconsult if future social work needs arise. CSW signing off.   CSW Plan/Description:  Child Protective Service Report  (CPS report was made in [facility] )    Tajon Moring, Veronia Beets, [place] 04/04/2017, 3:02 PM

## 2017-04-04 NOTE — Anesthesia Postprocedure Evaluation (Signed)
Anesthesia Post Note  Patient: Kendra SimasSarah Carey  Procedure(s) Performed: * No procedures listed *  Patient location during evaluation: Mother Baby Anesthesia Type: Epidural Level of consciousness: awake and alert Pain management: pain level controlled Vital Signs Assessment: post-procedure vital signs reviewed and stable Respiratory status: spontaneous breathing Cardiovascular status: stable Postop Assessment: no headache, no backache and epidural receding Anesthetic complications: no     Last Vitals:  Vitals:   04/03/17 2324 04/04/17 0445  BP: 109/73 (!) 104/49  Pulse: 86 66  Resp: 18 17  Temp: 36.9 C 37 C    Last Pain:  Vitals:   04/04/17 0445  TempSrc: Oral  PainSc:                  [name],  [name]

## 2017-04-04 NOTE — Progress Notes (Signed)
Post Partum Day # 1, s/p SVD  Subjective: no complaints, up ad lib, voiding and tolerating PO  Objective: Temp:  [97.4 F (36.3 C)-98.7 F (37.1 C)] 97.4 F (36.3 C) (06/12 1245) Pulse Rate:  [64-89] 64 (06/12 1245) Resp:  [16-20] 16 (06/12 1245) BP: (101-117)/(44-75) 116/70 (06/12 1245) SpO2:  [97 %-100 %] 99 % (06/12 1245)  Physical Exam:  General: alert and no distress  Lungs: clear to auscultation bilaterally Breasts: normal appearance, no masses or tenderness Heart: regular rate and rhythm, S1, S2 normal, no murmur, click, rub or gallop Abdomen: soft, non-tender; bowel sounds normal; no masses,  no organomegaly Pelvis: Lochia: appropriate, Uterine Fundus: firm Extremities: DVT Evaluation: No evidence of DVT seen on physical exam. Negative Homan's sign. No cords or calf tenderness. No significant calf/ankle edema.   Recent Labs  04/03/17 0339 04/04/17 0507  HGB 13.8 12.1  HCT 39.7 35.3    Assessment/Plan: Doing well postpartum.  Breast and bottle feeding. Advised on pumping for first 5 days since UDS + for marijuana.   Circumcision after discharge.  Contraception undecided. Plan for discharge tomorrow.    LOS: 1 day   Hildred Laserherry, Presly Steinruck, MD [facility] 04/04/2017 3:00 PM

## 2017-04-05 MED ORDER — IBUPROFEN 800 MG PO TABS: 800.0000 mg | ORAL_TABLET | Freq: Four times a day (QID) | ORAL | 1 refills | Status: DC

## 2017-04-05 NOTE — Discharge Summary (Signed)
Obstetric Discharge Summary Reason for Admission: rupture of membranes at 39.2 weeks Prenatal Procedures: ultrasound Intrapartum Procedures: spontaneous vaginal delivery Postpartum Procedures: none Complications-Operative and Postpartum: none Hemoglobin  Date Value Ref Range Status  04/04/2017 12.1 12.0 - 16.0 g/dL Final  16/10/960403/29/2018 54.013.0 11.1 - 15.9 g/dL Final   HCT  Date Value Ref Range Status  04/04/2017 35.3 35.0 - 47.0 % Final   Hematocrit  Date Value Ref Range Status  01/19/2017 37.8 34.0 - 46.6 % Final    Physical Exam:  Vitals:   04/05/17 0430 04/05/17 0747  BP:  112/64  Pulse:  60  Resp:  18  Temp: 97.9 F (36.6 C) 98 F (36.7 C)   General: alert and no distress Lochia: appropriate Uterine Fundus: firm Incision: none DVT Evaluation: No evidence of DVT seen on physical exam. Negative Homan's sign. No cords or calf tenderness. No significant calf/ankle edema.  Discharge Diagnoses: Term Pregnancy-delivered and SROM  Discharge Information: Date: 04/05/2017 Activity: pelvic rest Diet: routine Medications: PNV and Ibuprofen Condition: stable Instructions: refer to practice specific booklet Discharge to: home Follow-up Information    Jeralyn BennettLawhorn, Kendra Carey, CNM. Schedule an appointment as soon as possible for a visit in 6 day(s).   Specialties:  Certified Nurse Midwife, Obstetrics and Gynecology, Radiology Why:  please call [facility] and make your own 6 week follow up appointment Contact information: [address] [place] [address] [phone]        Kendra Carey, Kendra Rickett, Kendra Carey Follow up in 6 week(s).   Specialties:  Obstetrics and Gynecology, Radiology Why:  Postpartum check Contact information: [address] [place] [address] [phone]           Newborn Data: Live born female  Birth Weight: 7 lb 4.8 oz (3310 g) APGAR: 8, 9  Home with mother.  Kendra Carey 04/05/2017, 7:35 AM

## 2017-04-05 NOTE — Progress Notes (Signed)
Reviewed all patients discharge instructions and handouts regarding postpartum bleeding, no intercourse for 6 weeks, signs and symptoms of mastitis and postpartum bleu's. Reviewed discharge instructions for newborn regarding proper cord care, how and when to bathe the newborn, nail care, proper way to take the baby's temperature, along with safe sleep. All questions have been answered at this time. Patient discharged via wheelchair with axillary.  

## 2017-04-05 NOTE — Discharge Instructions (Signed)

## 2017-04-05 NOTE — Progress Notes (Signed)
Post Partum Day # 2, s/p SVD  Subjective: no complaints, up ad lib, voiding and tolerating PO.  Objective: Vitals:   04/04/17 1600 04/04/17 1925 04/05/17 0025 04/05/17 0430  BP:  111/74    Pulse:  79    Resp:  20    Temp: 98 F (36.7 C) 98.3 F (36.8 C) 98.3 F (36.8 C) 97.9 F (36.6 C)  TempSrc: Oral Oral Oral Oral  SpO2:  100%    Weight:      Height:        Physical Exam:  General: alert and no distress  Lungs: clear to auscultation bilaterally Breasts: normal appearance, no masses or tenderness Heart: regular rate and rhythm, S1, S2 normal, no murmur, click, rub or gallop Abdomen: soft, non-tender; bowel sounds normal; no masses,  no organomegaly Pelvis: Lochia: appropriate, Uterine Fundus: firm Extremities: DVT Evaluation: No evidence of DVT seen on physical exam. Negative Homan's sign. No cords or calf tenderness. No significant calf/ankle edema.   Recent Labs  04/03/17 0339 04/04/17 0507  HGB 13.8 12.1  HCT 39.7 35.3    Assessment/Plan: Doing well postpartum.  Breast and bottle feeding. Advised on pumping/dumping for first 5 days since UDS + for marijuana.   Circumcision after discharge.  Contraception undecided. To discuss at postpartum visit Plan for discharge today.    LOS: 2 days   Hildred Laserherry, Elon Lomeli, MD [facility] 04/05/2017 7:32 AM

## 2017-04-05 NOTE — Progress Notes (Signed)
Clinical Child psychotherapistocial Worker (CSW) received a call from [facility] [employer] Services (CPS) worker Markus DaftCynthia Carey [phone]. Per Kendra Beechamynthia the CPS case has been accepted by [facility] and she is the worker assigned to the case. CSW made Kendra BeechamCynthia aware that mother and infant will D/C home today between 12 noon and 1 pm per RN. Per Kendra Beechamynthia she will try to make it to the hospital to visit mother and infant before they leave however she stated that if they discharge before she can get here then she will follow up with them at home. Per Kendra Beechamynthia their discharge does not need to be held and they are free to go home anytime today. CSW faxed Kendra BeechamCynthia requested clinicals. RN aware of above.  Baker Hughes IncorporatedBailey Walker Sitar, LCSW [phone]

## 2017-04-27 ENCOUNTER — Telehealth: Payer: Self-pay

## 2017-04-27 NOTE — Telephone Encounter (Signed)
Called pt informed her that FMLA was completed and faxed with confirmation.

## 2017-05-24 ENCOUNTER — Ambulatory Visit (INDEPENDENT_AMBULATORY_CARE_PROVIDER_SITE_OTHER): Payer: Medicaid Other | Admitting: Obstetrics and Gynecology

## 2017-05-24 ENCOUNTER — Encounter: Payer: Self-pay | Admitting: Obstetrics and Gynecology

## 2017-05-24 DIAGNOSIS — Z3049 Encounter for surveillance of other contraceptives: Secondary | ICD-10-CM

## 2017-05-24 MED ORDER — ETONOGESTREL-ETHINYL ESTRADIOL 0.12-0.015 MG/24HR VA RING: 1.0000 | VAGINAL_RING | VAGINAL | 3 refills | Status: AC

## 2017-05-24 NOTE — Patient Instructions (Signed)
Ethinyl Estradiol; Etonogestrel vaginal ring What is this medicine? ETHINYL ESTRADIOL; ETONOGESTREL (ETH in il es tra DYE ole; et oh noe JES trel) vaginal ring is a flexible, vaginal ring used as a contraceptive (birth control method). This medicine combines two types of female hormones, an estrogen and a progestin. This ring is used to prevent ovulation and pregnancy. Each ring is effective for one month. This medicine may be used for other purposes; ask your health care provider or pharmacist if you have questions. COMMON BRAND NAME(S): NuvaRing What should I tell my health care provider before I take this medicine? They need to know if you have or ever had any of these conditions: -abnormal vaginal bleeding -blood vessel disease or blood clots -breast, cervical, endometrial, ovarian, liver, or uterine cancer -diabetes -gallbladder disease -heart disease or recent heart attack -high blood pressure -high cholesterol -kidney disease -liver disease -migraine headaches -stroke -systemic lupus erythematosus (SLE) -tobacco smoker -an unusual or allergic reaction to estrogens, progestins, other medicines, foods, dyes, or preservatives -pregnant or trying to get pregnant -breast-feeding How should I use this medicine? Insert the ring into your vagina as directed. Follow the directions on the prescription label. The ring will remain place for 3 weeks and is then removed for a 1-week break. A new ring is inserted 1 week after the last ring was removed, on the same day of the week. Check often to make sure the ring is still in place, especially before and after sexual intercourse. If the ring was out of the vagina for an unknown amount of time, you may not be protected from pregnancy. Perform a pregnancy test and call your doctor. Do not use more often than directed. A patient package insert for the product will be given with each prescription and refill. Read this sheet carefully each time. The  sheet may change frequently. Contact your pediatrician regarding the use of this medicine in children. Special care may be needed. This medicine has been used in female children who have started having menstrual periods. Overdosage: If you think you have taken too much of this medicine contact a poison control center or emergency room at once. NOTE: This medicine is only for you. Do not share this medicine with others. What if I miss a dose? You will need to replace your vaginal ring once a month as directed. If the ring should slip out, or if you leave it in longer or shorter than you should, contact your health care professional for advice. What may interact with this medicine? Do not take this medicine with the following medication: -dasabuvir; ombitasvir; paritaprevir; ritonavir -ombitasvir; paritaprevir; ritonavir This medicine may also interact with the following medications: -acetaminophen -antibiotics or medicines for infections, especially rifampin, rifabutin, rifapentine, and griseofulvin, and possibly penicillins or tetracyclines -aprepitant -ascorbic acid (vitamin C) -atorvastatin -barbiturate medicines, such as phenobarbital -bosentan -carbamazepine -caffeine -clofibrate -cyclosporine -dantrolene -doxercalciferol -felbamate -grapefruit juice -hydrocortisone -medicines for anxiety or sleeping problems, such as diazepam or temazepam -medicines for diabetes, including pioglitazone -modafinil -mycophenolate -nefazodone -oxcarbazepine -phenytoin -prednisolone -ritonavir or other medicines for HIV infection or AIDS -rosuvastatin -selegiline -soy isoflavones supplements -St. John's wort -tamoxifen or raloxifene -theophylline -thyroid hormones -topiramate -warfarin This list may not describe all possible interactions. Give your health care provider a list of all the medicines, herbs, non-prescription drugs, or dietary supplements you use. Also tell them if you smoke,  drink alcohol, or use illegal drugs. Some items may interact with your medicine. What should I watch for while using   this medicine? Visit your doctor or health care professional for regular checks on your progress. You will need a regular breast and pelvic exam and Pap smear while on this medicine. Use an additional method of contraception during the first cycle that you use this ring. Do not use a diaphragm or female condom, as the ring can interfere with these birth control methods and their proper placement. If you have any reason to think you are pregnant, stop using this medicine right away and contact your doctor or health care professional. If you are using this medicine for hormone related problems, it may take several cycles of use to see improvement in your condition. Smoking increases the risk of getting a blood clot or having a stroke while you are using hormonal birth control, especially if you are more than 22 years old. You are strongly advised not to smoke. This medicine can make your body retain fluid, making your fingers, hands, or ankles swell. Your blood pressure can go up. Contact your doctor or health care professional if you feel you are retaining fluid. This medicine can make you more sensitive to the sun. Keep out of the sun. If you cannot avoid being in the sun, wear protective clothing and use sunscreen. Do not use sun lamps or tanning beds/booths. If you wear contact lenses and notice visual changes, or if the lenses begin to feel uncomfortable, consult your eye care specialist. In some women, tenderness, swelling, or minor bleeding of the gums may occur. Notify your dentist if this happens. Brushing and flossing your teeth regularly may help limit this. See your dentist regularly and inform your dentist of the medicines you are taking. If you are going to have elective surgery, you may need to stop using this medicine before the surgery. Consult your health care professional  for advice. This medicine does not protect you against HIV infection (AIDS) or any other sexually transmitted diseases. What side effects may I notice from receiving this medicine? Side effects that you should report to your doctor or health care professional as soon as possible: -breast tissue changes or discharge -changes in vaginal bleeding during your period or between your periods -chest pain -coughing up blood -dizziness or fainting spells -headaches or migraines -leg, arm or groin pain -severe or sudden headaches -stomach pain (severe) -sudden shortness of breath -sudden loss of coordination, especially on one side of the body -speech problems -symptoms of vaginal infection like itching, irritation or unusual discharge -tenderness in the upper abdomen -vomiting -weakness or numbness in the arms or legs, especially on one side of the body -yellowing of the eyes or skin Side effects that usually do not require medical attention (report to your doctor or health care professional if they continue or are bothersome): -breakthrough bleeding and spotting that continues beyond the 3 initial cycles of pills -breast tenderness -mood changes, anxiety, depression, frustration, anger, or emotional outbursts -increased sensitivity to sun or ultraviolet light -nausea -skin rash, acne, or brown spots on the skin -weight gain (slight) This list may not describe all possible side effects. Call your doctor for medical advice about side effects. You may report side effects to FDA at 1-800-FDA-1088. Where should I keep my medicine? Keep out of the reach of children. Store at room temperature between 15 and 30 degrees C (59 and 86 degrees F) for up to 4 months. The product will expire after 4 months. Protect from light. Throw away any unused medicine after the expiration date. NOTE: This   sheet is a summary. It may not cover all possible information. If you have questions about this medicine, talk  to your doctor, pharmacist, or health care provider.  2018 Elsevier/Gold Standard (2016-06-17 17:00:31)  

## 2017-05-24 NOTE — Progress Notes (Signed)
   OBSTETRICS POSTPARTUM CLINIC PROGRESS NOTE  Subjective:     Kendra Carey is a 21 y.o. [id number] female who presents for a postpartum visit. She is 7 weeks postpartum following a spontaneous vaginal delivery. I have fully reviewed the prenatal and intrapartum course. The delivery was at 39 gestational weeks.  Anesthesia: epidural. Postpartum course has been well. Baby's course has been well. Baby is feeding by bottle - Similac Advance. Bleeding: patient has resumed menses, with last menstrual period 05/08/2017. Bowel function is normal. Bladder function is normal. Patient is sexually active. Contraception method desired is NuvaRing vaginal inserts. Postpartum depression screening: negative, Score of 6.  The following portions of the patient's history were reviewed and updated as appropriate: allergies, current medications, past family history, past medical history, past social history, past surgical history and problem list.  Review of Systems Pertinent items noted in HPI and remainder of comprehensive ROS otherwise negative.   Objective:    BP 107/67 (BP Location: Left Arm, Patient Position: Sitting, Cuff Size: Normal)   Pulse 76   Ht 5\' 4"  (1.626 m)   Wt 135 lb 8 oz (61.5 kg)   LMP 05/08/2017   Breastfeeding? No   BMI 23.26 kg/m   General:  alert and no distress   Breasts:  inspection negative, no nipple discharge or bleeding, no masses or nodularity palpable  Lungs: clear to auscultation bilaterally  Heart:  regular rate and rhythm, S1, S2 normal, no murmur, click, rub or gallop  Abdomen: soft, non-tender; bowel sounds normal; no masses,  no organomegaly.     Vulva:  normal  Vagina: normal vagina, no discharge, exudate, lesion, or erythema  Cervix:  no cervical motion tenderness and no lesions  Corpus: normal size, contour, position, consistency, mobility, non-tender  Adnexa:  normal adnexa and no mass, fullness, tenderness  Rectal Exam: Not performed.         Labs:  Lab  Results  Component Value Date   HGB 12.1 04/04/2017     Assessment:    Routine postpartum exam.     Contraception management  Plan:   1. Contraception: NuvaRing vaginal inserts.  Will prescribe.  Advised on Sunday start. UPT negative today.  2. Follow up in: 6 months for annual exam, or as needed.    Hildred Laserherry, Seanne Chirico, MD [facility]

## 2020-04-15 ENCOUNTER — Other Ambulatory Visit: Payer: Self-pay

## 2020-04-15 ENCOUNTER — Ambulatory Visit (LOCAL_COMMUNITY_HEALTH_CENTER): Payer: 59

## 2020-04-15 VITALS — BP 113/71 | Ht 64.0 in | Wt 109.0 lb

## 2020-04-15 DIAGNOSIS — Z3201 Encounter for pregnancy test, result positive: Secondary | ICD-10-CM

## 2020-04-15 LAB — PREGNANCY, URINE: Preg Test, Ur: POSITIVE — AB

## 2020-04-15 MED ORDER — PRENATAL VITAMIN 27-0.8 MG PO TABS: 1.0000 | ORAL_TABLET | Freq: Every day | ORAL | 0 refills | Status: AC

## 2020-04-15 NOTE — Progress Notes (Signed)
Pt already has prenatal appt scheduled with [facility]; declines preadmit.

## 2021-01-19 ENCOUNTER — Telehealth: Payer: Self-pay | Admitting: Licensed Clinical Social Worker

## 2021-01-19 NOTE — Telephone Encounter (Signed)
-----   Message from Ellison Carwin sent at 01/19/2021 10:05 AM EDT ----- Regarding: mh referral Morning Ma'am. Hope all is well.  I would like to refer this member for mh services.  She reports possible anxiety, hx of depression,ADHD (but has never been diagnosed). She recently delivered but reports no pp depression.  Thanks,

## 2023-07-04 ENCOUNTER — Ambulatory Visit
Admission: EM | Admit: 2023-07-04 | Discharge: 2023-07-04 | Disposition: A | Discharge status: Home / Self Care | Payer: Medicaid Other | Attending: Physician Assistant | Admitting: Physician Assistant

## 2023-07-04 DIAGNOSIS — R062 Wheezing: Secondary | ICD-10-CM

## 2023-07-04 DIAGNOSIS — J019 Acute sinusitis, unspecified: Secondary | ICD-10-CM

## 2023-07-04 DIAGNOSIS — J209 Acute bronchitis, unspecified: Secondary | ICD-10-CM

## 2023-07-04 MED ORDER — PREDNISONE 10 MG PO TABS: ORAL_TABLET | ORAL | 0 refills | Status: AC

## 2023-07-04 MED ORDER — PROMETHAZINE-DM 6.25-15 MG/5ML PO SYRP: 5.0000 mL | ORAL_SOLUTION | Freq: Four times a day (QID) | ORAL | 0 refills | Status: AC | PRN

## 2023-07-04 MED ORDER — ALBUTEROL SULFATE HFA 108 (90 BASE) MCG/ACT IN AERS: 1.0000 | INHALATION_SPRAY | Freq: Four times a day (QID) | RESPIRATORY_TRACT | 0 refills | Status: AC | PRN

## 2023-07-04 MED ORDER — DOXYCYCLINE HYCLATE 100 MG PO CAPS: 100.0000 mg | ORAL_CAPSULE | Freq: Two times a day (BID) | ORAL | 0 refills | Status: AC

## 2023-07-04 NOTE — ED Triage Notes (Signed)
Pt c/o cough,bodyaches & nasal/chest congestion x2 wks. Has tried dayquil & nyquil w/o relief.

## 2023-07-04 NOTE — ED Provider Notes (Signed)
[facility]    CSN: [id number] Arrival date & time: 07/04/23  1817      History   Chief Complaint Chief Complaint  Patient presents with   Nasal Congestion   Cough   Generalized Body Aches    HPI Kendra Carey is a 27 y.o. female presenting for 2-week history of cough and congestion.  She states that she had typical allergy symptoms for the first week and started to feel little better and then over the past 4 to 5 days she has developed body aches, fatigue, productive cough, greenish nasal drainage, wheezing and shortness of breath.  Denies fever.  No history of asthma.  She is a current every day smoker.  She says that she has been taking DayQuil and NyQuil over-the-counter and it was initially helping but is no longer helping.  Denies any known exposure to COVID.  No other complaints.  HPI  History reviewed. No pertinent past medical history.  Patient Active Problem List   Diagnosis Date Noted   GERD (gastroesophageal reflux disease) 09/12/2016   History of stomach ulcers 09/12/2016    Past Surgical History:  Procedure Laterality Date   DILATION AND CURETTAGE OF UTERUS  2015   SAB   WISDOM TOOTH EXTRACTION Right 2018    OB History     Gravida  4   Para  1   Term  1   Preterm  0   AB  2   Living  1      SAB  1   IAB  1   Ectopic  0   Multiple  0   Live Births  1        Obstetric Comments  2015-pregnancy 10wks but stopped development at 7wk          Home Medications    Prior to Admission medications   Medication Sig Start Date End Date Taking? Authorizing Provider  albuterol (VENTOLIN HFA) 108 (90 Base) MCG/ACT inhaler Inhale 1-2 puffs into the lungs every 6 (six) hours as needed for wheezing or shortness of breath. 07/04/23  Yes Eusebio Friendly B, PA-C  doxycycline (VIBRAMYCIN) 100 MG capsule Take 1 capsule (100 mg total) by mouth 2 (two) times daily for 7 days. 07/04/23 07/11/23 Yes Shirlee Latch, PA-C  predniSONE (DELTASONE)  10 MG tablet Take 6 tabs p.o. on day 1 and decrease by 1 tablet daily until complete 07/04/23  Yes Shirlee Latch, PA-C  promethazine-dextromethorphan (PROMETHAZINE-DM) 6.25-15 MG/5ML syrup Take 5 mLs by mouth 4 (four) times daily as needed. 07/04/23  Yes Shirlee Latch, PA-C  etonogestrel-ethinyl estradiol (NUVARING) 0.12-0.015 MG/24HR vaginal ring Place 1 each vaginally every 28 (twenty-eight) days. Insert vaginally and leave in place for 3 consecutive weeks, then remove for 1 week. Patient not taking: Reported on 04/15/2020 05/24/17   Hildred Laser, MD  Prenatal Vit-Fe Fumarate-FA (PRENATAL VITAMINS PO) Take 1 tablet by mouth 1 day or 1 dose.    [provider]    Family History Family History  Problem Relation Age of Onset   Asthma Mother    Asthma Father    Diabetes Father        BORDERLINE   Heart failure Father    Hyperlipidemia Father    Hypertension Father    Stroke Father        PARTIAL   Migraines Sister    Rheum arthritis Maternal Grandmother    Heart failure Maternal Grandmother    Cancer Maternal Grandfather  COLON, PROSTATE   Heart failure Maternal Grandfather    Hypertension Maternal Grandfather    Stroke Maternal Grandfather    Cancer Paternal Grandmother        PANCREATIC    Social History Social History   Tobacco Use   Smoking status: Every Day   Smokeless tobacco: Never   Tobacco comments:    reduced to 2 singles per day, was a whole pack  Vaping Use   Vaping status: Every Day  Substance Use Topics   Alcohol use: Not Currently   Drug use: Not Currently    Types: Marijuana     Allergies   Patient has no known allergies.   Review of Systems Review of Systems  Constitutional:  Positive for fatigue. Negative for chills, diaphoresis and fever.  HENT:  Positive for congestion, rhinorrhea, sinus pressure and sinus pain. Negative for ear pain and sore throat.   Respiratory:  Positive for cough, shortness of breath and wheezing.    Cardiovascular:  Negative for chest pain.  Gastrointestinal:  Negative for abdominal pain, nausea and vomiting.  Musculoskeletal:  Positive for myalgias.  Skin:  Negative for rash.  Neurological:  Negative for weakness and headaches.  Hematological:  Negative for adenopathy.     Physical Exam Triage Vital Signs ED Triage Vitals  Encounter Vitals Group     BP --      Systolic BP Percentile --      Diastolic BP Percentile --      Pulse --      Resp 07/04/23 1823 16     Temp --      Temp Source 07/04/23 1823 Oral     SpO2 --      Weight 07/04/23 1822 130 lb (59 kg)     Height 07/04/23 1822 5\' 3"  (1.6 m)     Head Circumference --      Peak Flow --      Pain Score 07/04/23 1825 6     Pain Loc --      Pain Education --      Exclude from Growth Chart --    No data found.  Updated Vital Signs BP (!) 142/73 (BP Location: Left Arm)   Pulse 83   Temp 98.3 F (36.8 C) (Oral)   Resp 16   Ht 5\' 3"  (1.6 m)   Wt 130 lb (59 kg)   LMP 07/01/2023 (Exact Date)   SpO2 98%   BMI 23.03 kg/m     Physical Exam Vitals and nursing note reviewed.  Constitutional:      General: She is not in acute distress.    Appearance: Normal appearance. She is not ill-appearing or toxic-appearing.  HENT:     Head: Normocephalic and atraumatic.     Right Ear: Tympanic membrane, ear canal and external ear normal.     Left Ear: Tympanic membrane, ear canal and external ear normal.     Nose: Congestion present.     Mouth/Throat:     Mouth: Mucous membranes are moist.     Pharynx: Oropharynx is clear.  Eyes:     General: No scleral icterus.       Right eye: No discharge.        Left eye: No discharge.     Conjunctiva/sclera: Conjunctivae normal.  Cardiovascular:     Rate and Rhythm: Normal rate and regular rhythm.     Heart sounds: Normal heart sounds.  Pulmonary:     Effort: Pulmonary effort is normal.  No respiratory distress.     Breath sounds: Wheezing and rhonchi present.   Musculoskeletal:     Cervical back: Neck supple.  Skin:    General: Skin is dry.  Neurological:     General: No focal deficit present.     Mental Status: She is alert. Mental status is at baseline.     Motor: No weakness.     Gait: Gait normal.  Psychiatric:        Mood and Affect: Mood normal.        Behavior: Behavior normal.        Thought Content: Thought content normal.      UC Treatments / Results  Labs (all labs ordered are listed, but only abnormal results are displayed) Labs Reviewed - No data to display   EKG   Radiology No results found.  Procedures Procedures (including critical care time)  Medications Ordered in UC Medications - No data to display  Initial Impression / Assessment and Plan / UC Course  I have reviewed the triage vital signs and the nursing notes.  Pertinent labs & imaging results that were available during my care of the patient were reviewed by me and considered in my medical decision making (see chart for details).   27 year old female presents for 2-week history of cough and congestion.  She was feeling better after the first week with only minor symptoms.  However the past 4 days she has developed productive cough, sinus pain, greenish nasal drainage, wheezing and shortness of breath.  She is afebrile.  Overall well-appearing.  On exam is nasal congestion, sinus tenderness and diffuse wheezing and rhonchi throughout all lung fields.  Offered nebulized breathing treatment but she declines.  Sent inhaler to pharmacy.  Advised patient she has bronchitis and sinusitis.  Will treat at this time with doxycycline, prednisone taper, Promethazine DM.  Encouraged increasing rest and fluids.  Advised that she may be sick for couple more weeks but she should be starting to improve over the next week.  Encouraged her to return if she develops a fever or any worsening symptoms or if she is not better in another couple weeks.  Work note  given.   Final Clinical Impressions(s) / UC Diagnoses   Final diagnoses:  Acute bronchitis, unspecified organism  Acute sinusitis, recurrence not specified, unspecified location  Wheezing   Discharge Instructions   None    ED Prescriptions     Medication Sig Dispense Auth. Provider   promethazine-dextromethorphan (PROMETHAZINE-DM) 6.25-15 MG/5ML syrup Take 5 mLs by mouth 4 (four) times daily as needed. 118 mL Eusebio Friendly B, PA-C   predniSONE (DELTASONE) 10 MG tablet Take 6 tabs p.o. on day 1 and decrease by 1 tablet daily until complete 21 tablet Eusebio Friendly B, PA-C   albuterol (VENTOLIN HFA) 108 (90 Base) MCG/ACT inhaler Inhale 1-2 puffs into the lungs every 6 (six) hours as needed for wheezing or shortness of breath. 1 g Eusebio Friendly B, PA-C   doxycycline (VIBRAMYCIN) 100 MG capsule Take 1 capsule (100 mg total) by mouth 2 (two) times daily for 7 days. 14 capsule Shirlee Latch, PA-C      PDMP not reviewed this encounter.   Shirlee Latch, PA-C 07/04/23 [phone]

## 2023-08-09 ENCOUNTER — Ambulatory Visit
Admission: EM | Admit: 2023-08-09 | Discharge: 2023-08-09 | Disposition: A | Discharge status: Home / Self Care | Payer: Medicaid Other | Attending: Physician Assistant | Admitting: Physician Assistant

## 2023-08-09 DIAGNOSIS — J019 Acute sinusitis, unspecified: Secondary | ICD-10-CM

## 2023-08-09 DIAGNOSIS — L03213 Periorbital cellulitis: Secondary | ICD-10-CM | POA: Diagnosis not present

## 2023-08-09 DIAGNOSIS — H00021 Hordeolum internum right upper eyelid: Secondary | ICD-10-CM

## 2023-08-09 MED ORDER — ERYTHROMYCIN 5 MG/GM OP OINT: TOPICAL_OINTMENT | OPHTHALMIC | 0 refills | Status: AC

## 2023-08-09 MED ORDER — IPRATROPIUM BROMIDE 0.06 % NA SOLN: 2.0000 | Freq: Four times a day (QID) | NASAL | 0 refills | Status: AC

## 2023-08-09 MED ORDER — AMOXICILLIN-POT CLAVULANATE 875-125 MG PO TABS: 1.0000 | ORAL_TABLET | Freq: Two times a day (BID) | ORAL | 0 refills | Status: AC

## 2023-08-09 NOTE — ED Triage Notes (Signed)
Patient states that sx started 2 days ago. Right side facial and eye swellling and pain. Nasal drainage. Body aches and fatigue

## 2023-08-09 NOTE — Discharge Instructions (Addendum)
-  You have a stye and a sinus infection.  I am worried about something called preseptal cellulitis. - Start antibiotics and take full course.  Also use the erythromycin ophthalmic ointment and nasal spray.  May consider taking Mucinex D.  Warm compresses multiple times throughout the day. - If you develop a fever, have pain of the eyeball when moving it, vision changes, dizziness, light sensitivity you need to go to the ER.  In this case, would be worried about something called orbital cellulitis which is a deeper infection.

## 2023-08-09 NOTE — ED Provider Notes (Signed)
[facility]    CSN: [id number] Arrival date & time: 08/09/23  1334      History   Chief Complaint Chief Complaint  Patient presents with   Facial Swelling   Fatigue    HPI Kendra Carey is a 27 y.o. female presenting for 2-day history of redness and swelling of right upper eyelid with associated pain.  Now she has redness, swelling of the right maxillary sinus with tenderness and greenish nasal drainage from the right side only.  She denies associated fever.  States her eyelid hurts when she moves her eye but her actual eye does not hurt.  Denies photophobia, fever.  Has had a cough but reports a history of smoker's cough.  She is a everyday smoker.  Patient seen here 1 month ago by me for acute bronchitis and sinusitis.  Treating at that time with doxycycline, prednisone, Promethazine DM and albuterol.  She reports getting better for about a week and 1/2 to 2 weeks before symptoms worsened again.  No report of any shortness of breath.  No other complaints.  HPI  History reviewed. No pertinent past medical history.  Patient Active Problem List   Diagnosis Date Noted   GERD (gastroesophageal reflux disease) 09/12/2016   History of stomach ulcers 09/12/2016    Past Surgical History:  Procedure Laterality Date   DILATION AND CURETTAGE OF UTERUS  2015   SAB   WISDOM TOOTH EXTRACTION Right 2018    OB History     Gravida  4   Para  1   Term  1   Preterm  0   AB  2   Living  1      SAB  1   IAB  1   Ectopic  0   Multiple  0   Live Births  1        Obstetric Comments  2015-pregnancy 10wks but stopped development at 7wk          Home Medications    Prior to Admission medications   Medication Sig Start Date End Date Taking? Authorizing Provider  amoxicillin-clavulanate (AUGMENTIN) 875-125 MG tablet Take 1 tablet by mouth every 12 (twelve) hours for 10 days. 08/09/23 08/19/23 Yes Eusebio Friendly B, PA-C  erythromycin ophthalmic ointment  Place a 1/2 inch ribbon of ointment into the upper right eyelid q6h 08/09/23 08/16/23 Yes Eusebio Friendly B, PA-C  ipratropium (ATROVENT) 0.06 % nasal spray Place 2 sprays into both nostrils 4 (four) times daily. 08/09/23  Yes Eusebio Friendly B, PA-C  albuterol (VENTOLIN HFA) 108 (90 Base) MCG/ACT inhaler Inhale 1-2 puffs into the lungs every 6 (six) hours as needed for wheezing or shortness of breath. 07/04/23   Shirlee Latch, PA-C  etonogestrel-ethinyl estradiol (NUVARING) 0.12-0.015 MG/24HR vaginal ring Place 1 each vaginally every 28 (twenty-eight) days. Insert vaginally and leave in place for 3 consecutive weeks, then remove for 1 week. Patient not taking: Reported on 04/15/2020 05/24/17   Hildred Laser, MD  predniSONE (DELTASONE) 10 MG tablet Take 6 tabs p.o. on day 1 and decrease by 1 tablet daily until complete 07/04/23   Shirlee Latch, PA-C  Prenatal Vit-Fe Fumarate-FA (PRENATAL VITAMINS PO) Take 1 tablet by mouth 1 day or 1 dose.    [provider]  promethazine-dextromethorphan (PROMETHAZINE-DM) 6.25-15 MG/5ML syrup Take 5 mLs by mouth 4 (four) times daily as needed. 07/04/23   Shirlee Latch, PA-C    Family History Family History  Problem Relation Age of Onset  Asthma Mother    Asthma Father    Diabetes Father        BORDERLINE   Heart failure Father    Hyperlipidemia Father    Hypertension Father    Stroke Father        PARTIAL   Migraines Sister    Rheum arthritis Maternal Grandmother    Heart failure Maternal Grandmother    Cancer Maternal Grandfather        COLON, PROSTATE   Heart failure Maternal Grandfather    Hypertension Maternal Grandfather    Stroke Maternal Grandfather    Cancer Paternal Grandmother        PANCREATIC    Social History Social History   Tobacco Use   Smoking status: Every Day   Smokeless tobacco: Never   Tobacco comments:    reduced to 2 singles per day, was a whole pack  Vaping Use   Vaping status: Every Day  Substance Use Topics    Alcohol use: Not Currently   Drug use: Not Currently    Types: Marijuana     Allergies   Patient has no known allergies.   Review of Systems Review of Systems  Constitutional:  Positive for fatigue. Negative for chills, diaphoresis and fever.  HENT:  Positive for congestion, rhinorrhea, sinus pressure and sinus pain. Negative for ear pain and sore throat.   Eyes:  Positive for pain. Negative for photophobia, discharge and visual disturbance.  Respiratory:  Positive for cough. Negative for shortness of breath and wheezing.   Cardiovascular:  Negative for chest pain.  Gastrointestinal:  Negative for abdominal pain, nausea and vomiting.  Musculoskeletal:  Negative for myalgias.  Skin:  Negative for rash.  Neurological:  Negative for weakness and headaches.  Hematological:  Negative for adenopathy.     Physical Exam Triage Vital Signs ED Triage Vitals  Encounter Vitals Group     BP --      Systolic BP Percentile --      Diastolic BP Percentile --      Pulse --      Resp 07/04/23 1823 16     Temp --      Temp Source 07/04/23 1823 Oral     SpO2 --      Weight 07/04/23 1822 130 lb (59 kg)     Height 07/04/23 1822 5\' 3"  (1.6 m)     Head Circumference --      Peak Flow --      Pain Score 07/04/23 1825 6     Pain Loc --      Pain Education --      Exclude from Growth Chart --    No data found.  Updated Vital Signs BP 113/79 (BP Location: Left Arm)   Pulse 93   Temp 98.4 F (36.9 C) (Oral)   Resp 19   LMP 07/28/2023   SpO2 100%     Physical Exam Vitals and nursing note reviewed.  Constitutional:      General: She is not in acute distress.    Appearance: Normal appearance. She is not ill-appearing or toxic-appearing.  HENT:     Head: Normocephalic and atraumatic.     Right Ear: Tympanic membrane, ear canal and external ear normal.     Left Ear: Tympanic membrane, ear canal and external ear normal.     Nose: Congestion present.     Right Sinus: Maxillary  sinus tenderness (with mild swelling and erythema) present.     Mouth/Throat:  Mouth: Mucous membranes are moist.     Pharynx: Oropharynx is clear.  Eyes:     General: No scleral icterus.       Right eye: Hordeolum present. No discharge.        Left eye: No discharge.     Conjunctiva/sclera: Conjunctivae normal.     Right eye: Right conjunctiva is not injected.     Left eye: Left conjunctiva is not injected.     Comments: Erythema/edema right upper eyelid with TTP. Tiny pustule noted inner upper eyelid consistent with stye  Cardiovascular:     Rate and Rhythm: Normal rate and regular rhythm.     Heart sounds: Normal heart sounds.  Pulmonary:     Effort: Pulmonary effort is normal. No respiratory distress.     Breath sounds: No wheezing or rhonchi.  Musculoskeletal:     Cervical back: Neck supple.  Skin:    General: Skin is dry.  Neurological:     General: No focal deficit present.     Mental Status: She is alert. Mental status is at baseline.     Motor: No weakness.     Gait: Gait normal.  Psychiatric:        Mood and Affect: Mood normal.        Behavior: Behavior normal.        Thought Content: Thought content normal.      UC Treatments / Results  Labs (all labs ordered are listed, but only abnormal results are displayed) Labs Reviewed - No data to display   EKG   Radiology No results found.  Procedures Procedures (including critical care time)  Medications Ordered in UC Medications - No data to display  Initial Impression / Assessment and Plan / UC Course  I have reviewed the triage vital signs and the nursing notes.  Pertinent labs & imaging results that were available during my care of the patient were reviewed by me and considered in my medical decision making (see chart for details).   27 year old female presents for 2-day history of right upper eyelid swelling, redness and pain, right maxillary sinus redness, swelling and tenderness, greenish nasal  drainage from right nostril.  Denies fever.  She is afebrile.  Overall well-appearing.  On exam is nasal congestion, right upper eyelid stye, swelling and right maxillary sinus tenderness. Chest CTA.  Clinical presentation consistent with acute sinusitis, stye and concern for possible preseptal/periorbital cellulitis.  Patient was treated with doxycycline last month.  Will treat at this time with Augmentin.  Also sent intranasal spray and erythromycin ophthalmic ointment.  Encouraged warm compresses, Mucinex D, rest and fluids.  Very close monitoring.  Discussed red flag signs and symptoms related to orbital cellulitis and advised her to go to ED if any of those occur.  Work note provided.  Final Clinical Impressions(s) / UC Diagnoses   Final diagnoses:  Preseptal cellulitis of right eye  Hordeolum internum of right upper eyelid  Acute sinusitis, recurrence not specified, unspecified location     Discharge Instructions      -You have a stye and a sinus infection.  I am worried about something called preseptal cellulitis. - Start antibiotics and take full course.  Also use the erythromycin ophthalmic ointment and nasal spray.  May consider taking Mucinex D.  Warm compresses multiple times throughout the day. - If you develop a fever, have pain of the eyeball when moving it, vision changes, dizziness, light sensitivity you need to go to the ER.  In  this case, would be worried about something called orbital cellulitis which is a deeper infection.     ED Prescriptions     Medication Sig Dispense Auth. Provider   amoxicillin-clavulanate (AUGMENTIN) 875-125 MG tablet Take 1 tablet by mouth every 12 (twelve) hours for 10 days. 20 tablet Eusebio Friendly B, PA-C   ipratropium (ATROVENT) 0.06 % nasal spray Place 2 sprays into both nostrils 4 (four) times daily. 15 mL Eusebio Friendly B, PA-C   erythromycin ophthalmic ointment Place a 1/2 inch ribbon of ointment into the upper right eyelid q6h 3.5 g  Shirlee Latch, PA-C      PDMP not reviewed this encounter.    Shirlee Latch, PA-C 08/09/23 1435

## 2024-05-03 ENCOUNTER — Encounter: Payer: Self-pay | Admitting: Emergency Medicine

## 2024-05-03 ENCOUNTER — Ambulatory Visit
Admission: EM | Admit: 2024-05-03 | Discharge: 2024-05-03 | Disposition: A | Discharge status: Home / Self Care | Attending: Emergency Medicine | Admitting: Emergency Medicine

## 2024-05-03 DIAGNOSIS — N3000 Acute cystitis without hematuria: Secondary | ICD-10-CM | POA: Insufficient documentation

## 2024-05-03 DIAGNOSIS — B3731 Acute candidiasis of vulva and vagina: Secondary | ICD-10-CM | POA: Insufficient documentation

## 2024-05-03 LAB — URINALYSIS, W/ REFLEX TO CULTURE (INFECTION SUSPECTED)
Glucose, UA: NEGATIVE mg/dL
Hgb urine dipstick: NEGATIVE
Nitrite: NEGATIVE
Specific Gravity, Urine: >1.03 — ABNORMAL HIGH (ref 1.005–1.030)
pH: 6 (ref 5.0–8.0)

## 2024-05-03 MED ORDER — NITROFURANTOIN MONOHYD MACRO 100 MG PO CAPS: 100.0000 mg | ORAL_CAPSULE | Freq: Two times a day (BID) | ORAL | 0 refills | Status: AC

## 2024-05-03 MED ORDER — PHENAZOPYRIDINE HCL 200 MG PO TABS: 200.0000 mg | ORAL_TABLET | Freq: Three times a day (TID) | ORAL | 0 refills | Status: AC

## 2024-05-03 MED ORDER — FLUCONAZOLE 150 MG PO TABS: 150.0000 mg | ORAL_TABLET | ORAL | 0 refills | Status: AC

## 2024-05-03 NOTE — Discharge Instructions (Addendum)
 Take the Macrobid  twice daily for 5 days with food for treatment of urinary tract infection.  Use the Pyridium  every 8 hours as needed for urinary discomfort.  This will turn your urine a bright red-orange.  Increase your oral fluid intake so that you increase your urine production and or flushing your urinary system.  Take an over-the-counter probiotic, such as Culturelle-Align-Activia, 1 hour after each dose of antibiotic to prevent diarrhea or yeast infections from forming.  We will culture urine and change the antibiotics if necessary.  Take the Diflucan  for treatment of your vaginal yeast infection.  Take 1 tablet now and repeat dosing every 3 days for total of 3 doses.  Your vaginal swab will be back next 1 to 2 days and if you test positive for any infection you will be contacted by phone.  If your results are negative they will appear in your MyChart.  Return for reevaluation, or see your primary care provider, for any new or worsening symptoms.

## 2024-05-03 NOTE — ED Triage Notes (Signed)
 Pt c/o left flank pain and dysuria. Started about a month ago but resolved and returned about a week ago. She states she has had chills and sweats.
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
## 2024-05-05 LAB — URINE CULTURE: Special Requests: NORMAL

## 2024-05-06 ENCOUNTER — Ambulatory Visit (HOSPITAL_COMMUNITY): Payer: Self-pay

## 2024-05-06 LAB — CERVICOVAGINAL ANCILLARY ONLY
Bacterial Vaginitis (gardnerella): POSITIVE — AB
Candida Glabrata: NEGATIVE
Candida Vaginitis: NEGATIVE
Comment: NEGATIVE
Comment: NEGATIVE
Comment: NEGATIVE

## 2024-05-07 MED ORDER — METRONIDAZOLE 500 MG PO TABS: 500.0000 mg | ORAL_TABLET | Freq: Two times a day (BID) | ORAL | 0 refills | Status: AC
